# Patient Record
Sex: Female | Born: 1935
Health system: Southern US, Community
[De-identification: ages and names within clinical notes are randomized; demographics above are authoritative.]

## PROBLEM LIST (undated history)

## (undated) DIAGNOSIS — J45909 Unspecified asthma, uncomplicated: Secondary | ICD-10-CM

## (undated) DIAGNOSIS — E119 Type 2 diabetes mellitus without complications: Secondary | ICD-10-CM

## (undated) DIAGNOSIS — E7800 Pure hypercholesterolemia, unspecified: Secondary | ICD-10-CM

## (undated) DIAGNOSIS — G894 Chronic pain syndrome: Secondary | ICD-10-CM

## (undated) DIAGNOSIS — M79641 Pain in right hand: Secondary | ICD-10-CM

## (undated) DIAGNOSIS — C801 Malignant (primary) neoplasm, unspecified: Secondary | ICD-10-CM

## (undated) DIAGNOSIS — N289 Disorder of kidney and ureter, unspecified: Secondary | ICD-10-CM

## (undated) DIAGNOSIS — B0229 Other postherpetic nervous system involvement: Secondary | ICD-10-CM

## (undated) DIAGNOSIS — M5136 Other intervertebral disc degeneration, lumbar region: Secondary | ICD-10-CM

## (undated) DIAGNOSIS — I10 Essential (primary) hypertension: Secondary | ICD-10-CM

## (undated) DIAGNOSIS — M1990 Unspecified osteoarthritis, unspecified site: Secondary | ICD-10-CM

## (undated) DIAGNOSIS — M79642 Pain in left hand: Secondary | ICD-10-CM

## (undated) DIAGNOSIS — Z5189 Encounter for other specified aftercare: Secondary | ICD-10-CM

## (undated) HISTORY — DX: Chronic pain syndrome: G89.4

## (undated) HISTORY — PX: TUBAL LIGATION: SHX77

## (undated) HISTORY — DX: Pain in left hand: M79.642

## (undated) HISTORY — DX: Pain in right hand: M79.641

## (undated) HISTORY — DX: Other postherpetic nervous system involvement: B02.29

## (undated) HISTORY — DX: Other intervertebral disc degeneration, lumbar region: M51.36

## (undated) HISTORY — DX: Pure hypercholesterolemia, unspecified: E78.00

## (undated) HISTORY — PX: SKIN BIOPSY: SHX1

## (undated) HISTORY — PX: PAROTID GLAND TUMOR EXCISION: SHX5221

---

## 1966-03-04 HISTORY — PX: ABDOMINAL HYSTERECTOMY: SHX81

## 1975-03-05 HISTORY — PX: KNEE SURGERY: SHX244

## 1978-03-04 HISTORY — PX: BLADDER SURGERY: SHX569

## 2004-03-04 HISTORY — PX: CATARACT EXTRACTION: SUR2

## 2004-03-04 HISTORY — PX: LAPAROSCOPIC GASTRIC BANDING: SHX1100

## 2006-03-04 HISTORY — PX: JOINT REPLACEMENT: SHX530

## 2008-03-04 HISTORY — PX: SHOULDER SURGERY: SHX246

## 2012-05-26 ENCOUNTER — Emergency Department (HOSPITAL_COMMUNITY): Payer: Medicare (Managed Care)

## 2012-05-26 ENCOUNTER — Emergency Department (HOSPITAL_COMMUNITY)
Admission: EM | Admit: 2012-05-26 | Discharge: 2012-05-26 | Disposition: A | Discharge status: Home / Self Care | Payer: Medicare (Managed Care) | Attending: Emergency Medicine | Admitting: Emergency Medicine

## 2012-05-26 ENCOUNTER — Encounter (HOSPITAL_COMMUNITY): Payer: Self-pay

## 2012-05-26 DIAGNOSIS — I10 Essential (primary) hypertension: Secondary | ICD-10-CM | POA: Insufficient documentation

## 2012-05-26 DIAGNOSIS — IMO0001 Reserved for inherently not codable concepts without codable children: Secondary | ICD-10-CM

## 2012-05-26 DIAGNOSIS — J45909 Unspecified asthma, uncomplicated: Secondary | ICD-10-CM | POA: Insufficient documentation

## 2012-05-26 DIAGNOSIS — Z79899 Other long term (current) drug therapy: Secondary | ICD-10-CM | POA: Insufficient documentation

## 2012-05-26 DIAGNOSIS — Z87448 Personal history of other diseases of urinary system: Secondary | ICD-10-CM | POA: Insufficient documentation

## 2012-05-26 DIAGNOSIS — Z85858 Personal history of malignant neoplasm of other endocrine glands: Secondary | ICD-10-CM | POA: Insufficient documentation

## 2012-05-26 DIAGNOSIS — W108XXA Fall (on) (from) other stairs and steps, initial encounter: Secondary | ICD-10-CM | POA: Insufficient documentation

## 2012-05-26 DIAGNOSIS — Z5189 Encounter for other specified aftercare: Secondary | ICD-10-CM | POA: Insufficient documentation

## 2012-05-26 DIAGNOSIS — F17200 Nicotine dependence, unspecified, uncomplicated: Secondary | ICD-10-CM | POA: Insufficient documentation

## 2012-05-26 DIAGNOSIS — Z8739 Personal history of other diseases of the musculoskeletal system and connective tissue: Secondary | ICD-10-CM | POA: Insufficient documentation

## 2012-05-26 DIAGNOSIS — E119 Type 2 diabetes mellitus without complications: Secondary | ICD-10-CM | POA: Insufficient documentation

## 2012-05-26 DIAGNOSIS — Y9289 Other specified places as the place of occurrence of the external cause: Secondary | ICD-10-CM | POA: Insufficient documentation

## 2012-05-26 DIAGNOSIS — S79929A Unspecified injury of unspecified thigh, initial encounter: Secondary | ICD-10-CM | POA: Insufficient documentation

## 2012-05-26 DIAGNOSIS — S79919A Unspecified injury of unspecified hip, initial encounter: Secondary | ICD-10-CM | POA: Insufficient documentation

## 2012-05-26 DIAGNOSIS — Y9389 Activity, other specified: Secondary | ICD-10-CM | POA: Insufficient documentation

## 2012-05-26 DIAGNOSIS — S40019A Contusion of unspecified shoulder, initial encounter: Secondary | ICD-10-CM | POA: Insufficient documentation

## 2012-05-26 DIAGNOSIS — S0990XA Unspecified injury of head, initial encounter: Secondary | ICD-10-CM | POA: Insufficient documentation

## 2012-05-26 DIAGNOSIS — S20229A Contusion of unspecified back wall of thorax, initial encounter: Secondary | ICD-10-CM | POA: Insufficient documentation

## 2012-05-26 HISTORY — DX: Unspecified osteoarthritis, unspecified site: M19.90

## 2012-05-26 HISTORY — DX: Type 2 diabetes mellitus without complications: E11.9

## 2012-05-26 HISTORY — DX: Encounter for other specified aftercare: Z51.89

## 2012-05-26 HISTORY — DX: Essential (primary) hypertension: I10

## 2012-05-26 HISTORY — DX: Disorder of kidney and ureter, unspecified: N28.9

## 2012-05-26 HISTORY — DX: Malignant (primary) neoplasm, unspecified: C80.1

## 2012-05-26 HISTORY — DX: Unspecified asthma, uncomplicated: J45.909

## 2012-05-26 MED ORDER — HYDROCODONE-ACETAMINOPHEN 5-325 MG PO TABS: 1.0000 | ORAL_TABLET | ORAL | Status: DC | PRN

## 2012-05-26 MED ORDER — ACETAMINOPHEN 325 MG PO TABS
650.0000 mg | ORAL_TABLET | Freq: Once | ORAL | Status: AC
  Administered 2012-05-26: 650 mg via ORAL
  Filled 2012-05-26: qty 2

## 2012-05-26 NOTE — ED Notes (Signed)
Family at bedside. 

## 2012-05-26 NOTE — ED Provider Notes (Signed)
I saw and evaluated the patient, reviewed the resident's note and I agree with the findings and plan.  Dg Lumbar Spine 2-3 Views  05/26/2012  *RADIOLOGY REPORT*  Clinical Data: Fall, low back pain  LUMBAR SPINE - 2-3 VIEW  Comparison: None.  Findings: Five lumbar-type vertebral bodies.  Normal lumbar lordosis.  No evidence of fracture or dislocation.  Vertebral body heights are maintained.  Mild to moderate multilevel degenerative changes.  Visualized bony pelvis appears intact.  Degenerative changes of the pubic symphysis.  Lap band, incompletely visualized.  IMPRESSION: No fracture or dislocation is seen.  Mild to moderate multilevel degenerative changes.   Original Report Authenticated By: Charline Bills, M.D.    Dg Shoulder Right  05/26/2012  *RADIOLOGY REPORT*  Clinical Data: Fall, right arm pain  RIGHT SHOULDER - 2+ VIEW  Comparison: None.  Findings: No fracture or dislocation is seen.  Mild degenerative changes of the glenohumeral and acromioclavicular joints.  Visualized soft tissues are unremarkable.  Visualized right lung is clear.  IMPRESSION: No fracture or dislocation is seen.   Original Report Authenticated By: Charline Bills, M.D.    Dg Forearm Right  05/26/2012  *RADIOLOGY REPORT*  Clinical Data: Arm pain after fall.  RIGHT FOREARM - 2 VIEW  Comparison: None.  Findings: No fracture or dislocation is noted.  Elbow and wrist joints appear normal.  No foreign body or soft tissue abnormality is noted.  IMPRESSION: Normal right forearm.   Original Report Authenticated By: Lupita Raider.,  M.D.    Ct Head Wo Contrast  05/26/2012  *RADIOLOGY REPORT*  Clinical Data:  Larey Seat walking down steps, no loss of consciousness, left parietal soft tissue swelling  CT HEAD WITHOUT CONTRAST CT CERVICAL SPINE WITHOUT CONTRAST  Technique:  Multidetector CT imaging of the head and cervical spine was performed following the standard protocol without intravenous contrast.  Multiplanar CT image reconstructions  of the cervical spine were also generated.  Comparison:  None  CT HEAD  Findings: Minimal age-related atrophy. Normal ventricular morphology. No midline shift or mass effect. Otherwise normal appearance of brain parenchyma. No intracranial hemorrhage, mass lesion or evidence of acute infarction. No extra-axial fluid collections. Nasal septal deviation to the right. Bones and sinuses otherwise unremarkable.  IMPRESSION: No acute intracranial abnormalities.  CT CERVICAL SPINE  Findings: Beam hardening artifacts question related to shoulders. Extensive atherosclerotic calcifications. Visualized skull base intact. Multilevel facet degenerative changes. Disc space narrowing with endplate spur formation at C5-C6 and C6- V7. Minimal anterolisthesis C4-C5 with similar to, likely due to degenerative facet disease. Vertebral body heights maintained without fracture or subluxation. Prevertebral soft tissues normal thickness. Fat containing soft tissue nodule identified left submandibular within the left mylohyoid muscle, 14 x 11 x 12 mm in size, question small intramuscular lipoma, unlikely to represent accessory salivary tissue.  IMPRESSION: Multilevel degenerative disc and facet disease changes of the cervical spine. No acute cervical spine abnormalities.   Original Report Authenticated By: Ulyses Southward, M.D.    Ct Cervical Spine Wo Contrast  05/26/2012  *RADIOLOGY REPORT*  Clinical Data:  Larey Seat walking down steps, no loss of consciousness, left parietal soft tissue swelling  CT HEAD WITHOUT CONTRAST CT CERVICAL SPINE WITHOUT CONTRAST  Technique:  Multidetector CT imaging of the head and cervical spine was performed following the standard protocol without intravenous contrast.  Multiplanar CT image reconstructions of the cervical spine were also generated.  Comparison:  None  CT HEAD  Findings: Minimal age-related atrophy. Normal ventricular morphology. No midline shift  or mass effect. Otherwise normal appearance of brain  parenchyma. No intracranial hemorrhage, mass lesion or evidence of acute infarction. No extra-axial fluid collections. Nasal septal deviation to the right. Bones and sinuses otherwise unremarkable.  IMPRESSION: No acute intracranial abnormalities.  CT CERVICAL SPINE  Findings: Beam hardening artifacts question related to shoulders. Extensive atherosclerotic calcifications. Visualized skull base intact. Multilevel facet degenerative changes. Disc space narrowing with endplate spur formation at C5-C6 and C6- V7. Minimal anterolisthesis C4-C5 with similar to, likely due to degenerative facet disease. Vertebral body heights maintained without fracture or subluxation. Prevertebral soft tissues normal thickness. Fat containing soft tissue nodule identified left submandibular within the left mylohyoid muscle, 14 x 11 x 12 mm in size, question small intramuscular lipoma, unlikely to represent accessory salivary tissue.  IMPRESSION: Multilevel degenerative disc and facet disease changes of the cervical spine. No acute cervical spine abnormalities.   Original Report Authenticated By: Ulyses Southward, M.D.    Dg Humerus Right  05/26/2012  *RADIOLOGY REPORT*  Clinical Data: Fall, right arm pain  RIGHT HUMERUS - 2+ VIEW  Comparison: None.  Findings: No fracture or dislocation is seen.  The visualized soft tissues are unremarkable.  IMPRESSION: No fracture or dislocation is seen.   Original Report Authenticated By: Charline Bills, M.D.    Dg Hand Complete Right  05/26/2012  *RADIOLOGY REPORT*  Clinical Data: Fall, right hand/arm pain  RIGHT HAND - COMPLETE 3+ VIEW  Comparison: None.  Findings: No fracture or dislocation is seen.  Moderate degenerative changes at the first MCP joint.  The visualized soft tissues are unremarkable.  IMPRESSION: No fracture or dislocation is seen.   Original Report Authenticated By: Charline Bills, M.D.   I personally reviewed the imaging tests through PACS system I reviewed available  ER/hospitalization records through the EMR  Ambulated without difficulty.  Discharge home in good condition.   Lyanne Co, MD 05/26/12 [phone]

## 2012-05-26 NOTE — ED Notes (Signed)
Pt visiting family.  Pt was going down stairs and her shoe came off and she slipped and fell down 5 stairs.  Pt c/o R shoulder pain, tailbone pain, L hip pain.  Pt also states that when she landed she hit the left side of her head behind the ear.  Pt denies LOC.

## 2012-05-26 NOTE — ED Provider Notes (Signed)
History     CSN: [id number]  Arrival date & time 05/26/12  7829   First MD Initiated Contact with Patient 05/26/12 [phone]      Chief Complaint  Patient presents with  . Fall  . Shoulder Pain  . Tailbone Pain  . Hip Pain    (Consider location/radiation/quality/duration/timing/severity/associated sxs/prior treatment) Patient is a 78 y.o. female presenting with fall, shoulder pain, and hip pain. The history is provided by the patient.  Fall The accident occurred 1 to 2 hours ago. The fall occurred while walking. Distance fallen: 4-5 stairs. She landed on a hard floor. The point of impact was the head and right shoulder. The pain is present in the head (right arm). The pain is moderate. She was ambulatory at the scene. Pertinent negatives include no numbness, no abdominal pain, no loss of consciousness and no tingling. The symptoms are aggravated by pressure on the injury and use of the injured limb. She has tried nothing for the symptoms.  Shoulder Pain Pertinent negatives include no abdominal pain or numbness.  Hip Pain Pertinent negatives include no abdominal pain or numbness.    Past Medical History  Diagnosis Date  . Arthritis   . Asthma   . Blood transfusion without reported diagnosis   . Cancer   . Diabetes mellitus without complication   . Hypertension   . Renal disorder     Past Surgical History  Procedure Laterality Date  . Parotid gland tumor excision    . Joint replacement    . Abdominal hysterectomy    . Tubal ligation    . Bladder surgery    . Laparoscopic gastric banding    . Skin biopsy      No family history on file.  History  Substance Use Topics  . Smoking status: Current Every Day Smoker  . Smokeless tobacco: Not on file  . Alcohol Use: Yes     Comment: RARELY    OB History   Grav Para Term Preterm Abortions TAB SAB Ect Mult Living                  Review of Systems  Gastrointestinal: Negative for abdominal pain.  Neurological: Negative  for tingling, loss of consciousness and numbness.  All other systems reviewed and are negative.    Allergies  Review of patient's allergies indicates not on file.  Home Medications  No current outpatient prescriptions on file.  BP 151/83  Pulse 59  Temp(Src) 98.3 F (36.8 C) (Oral)  Resp 16  SpO2 100%  Physical Exam  Nursing note and vitals reviewed. Constitutional: She is oriented to person, place, and time. She appears well-developed and well-nourished. No distress.  HENT:  Head: Normocephalic and atraumatic.    Mouth/Throat: Oropharynx is clear and moist.  Eyes: Conjunctivae are normal. Pupils are equal, round, and reactive to light. No scleral icterus.  Neck: Normal range of motion. Neck supple. Spinous process tenderness (c6-7) and muscular tenderness (right) present. Normal range of motion present.  Cardiovascular: Normal rate, regular rhythm, normal heart sounds and intact distal pulses.   No murmur heard. Pulmonary/Chest: Effort normal and breath sounds normal. No stridor. No respiratory distress. She has no decreased breath sounds. She has no wheezes. She has no rales. She exhibits no tenderness.  Abdominal: Soft. Bowel sounds are normal. She exhibits no distension. There is no tenderness. There is no rigidity, no rebound and no guarding.  Musculoskeletal: Normal range of motion.       Thoracic  back: She exhibits no tenderness and no bony tenderness.       Lumbar back: She exhibits bony tenderness.       Arms: No evidence of trauma to extremities, except as noted.  2+ distal pulses.    Neurological: She is alert and oriented to person, place, and time. GCS eye subscore is 4. GCS verbal subscore is 5. GCS motor subscore is 6.  Skin: Skin is warm and dry. No rash noted.  Psychiatric: She has a normal mood and affect. Her behavior is normal.    ED Course  Procedures (including critical care time)  Labs Reviewed  GLUCOSE, CAPILLARY   Dg Lumbar Spine 2-3  Views  05/26/2012  *RADIOLOGY REPORT*  Clinical Data: Fall, low back pain  LUMBAR SPINE - 2-3 VIEW  Comparison: None.  Findings: Five lumbar-type vertebral bodies.  Normal lumbar lordosis.  No evidence of fracture or dislocation.  Vertebral body heights are maintained.  Mild to moderate multilevel degenerative changes.  Visualized bony pelvis appears intact.  Degenerative changes of the pubic symphysis.  Lap band, incompletely visualized.  IMPRESSION: No fracture or dislocation is seen.  Mild to moderate multilevel degenerative changes.   Original Report Authenticated By: Charline Bills, M.D.    Dg Shoulder Right  05/26/2012  *RADIOLOGY REPORT*  Clinical Data: Fall, right arm pain  RIGHT SHOULDER - 2+ VIEW  Comparison: None.  Findings: No fracture or dislocation is seen.  Mild degenerative changes of the glenohumeral and acromioclavicular joints.  Visualized soft tissues are unremarkable.  Visualized right lung is clear.  IMPRESSION: No fracture or dislocation is seen.   Original Report Authenticated By: Charline Bills, M.D.    Dg Forearm Right  05/26/2012  *RADIOLOGY REPORT*  Clinical Data: Arm pain after fall.  RIGHT FOREARM - 2 VIEW  Comparison: None.  Findings: No fracture or dislocation is noted.  Elbow and wrist joints appear normal.  No foreign body or soft tissue abnormality is noted.  IMPRESSION: Normal right forearm.   Original Report Authenticated By: Lupita Raider.,  M.D.    Ct Head Wo Contrast  05/26/2012  *RADIOLOGY REPORT*  Clinical Data:  Larey Seat walking down steps, no loss of consciousness, left parietal soft tissue swelling  CT HEAD WITHOUT CONTRAST CT CERVICAL SPINE WITHOUT CONTRAST  Technique:  Multidetector CT imaging of the head and cervical spine was performed following the standard protocol without intravenous contrast.  Multiplanar CT image reconstructions of the cervical spine were also generated.  Comparison:  None  CT HEAD  Findings: Minimal age-related atrophy. Normal  ventricular morphology. No midline shift or mass effect. Otherwise normal appearance of brain parenchyma. No intracranial hemorrhage, mass lesion or evidence of acute infarction. No extra-axial fluid collections. Nasal septal deviation to the right. Bones and sinuses otherwise unremarkable.  IMPRESSION: No acute intracranial abnormalities.  CT CERVICAL SPINE  Findings: Beam hardening artifacts question related to shoulders. Extensive atherosclerotic calcifications. Visualized skull base intact. Multilevel facet degenerative changes. Disc space narrowing with endplate spur formation at C5-C6 and C6- V7. Minimal anterolisthesis C4-C5 with similar to, likely due to degenerative facet disease. Vertebral body heights maintained without fracture or subluxation. Prevertebral soft tissues normal thickness. Fat containing soft tissue nodule identified left submandibular within the left mylohyoid muscle, 14 x 11 x 12 mm in size, question small intramuscular lipoma, unlikely to represent accessory salivary tissue.  IMPRESSION: Multilevel degenerative disc and facet disease changes of the cervical spine. No acute cervical spine abnormalities.   Original Report Authenticated By:  Ulyses Southward, M.D.    Ct Cervical Spine Wo Contrast  05/26/2012  *RADIOLOGY REPORT*  Clinical Data:  Larey Seat walking down steps, no loss of consciousness, left parietal soft tissue swelling  CT HEAD WITHOUT CONTRAST CT CERVICAL SPINE WITHOUT CONTRAST  Technique:  Multidetector CT imaging of the head and cervical spine was performed following the standard protocol without intravenous contrast.  Multiplanar CT image reconstructions of the cervical spine were also generated.  Comparison:  None  CT HEAD  Findings: Minimal age-related atrophy. Normal ventricular morphology. No midline shift or mass effect. Otherwise normal appearance of brain parenchyma. No intracranial hemorrhage, mass lesion or evidence of acute infarction. No extra-axial fluid collections.  Nasal septal deviation to the right. Bones and sinuses otherwise unremarkable.  IMPRESSION: No acute intracranial abnormalities.  CT CERVICAL SPINE  Findings: Beam hardening artifacts question related to shoulders. Extensive atherosclerotic calcifications. Visualized skull base intact. Multilevel facet degenerative changes. Disc space narrowing with endplate spur formation at C5-C6 and C6- V7. Minimal anterolisthesis C4-C5 with similar to, likely due to degenerative facet disease. Vertebral body heights maintained without fracture or subluxation. Prevertebral soft tissues normal thickness. Fat containing soft tissue nodule identified left submandibular within the left mylohyoid muscle, 14 x 11 x 12 mm in size, question small intramuscular lipoma, unlikely to represent accessory salivary tissue.  IMPRESSION: Multilevel degenerative disc and facet disease changes of the cervical spine. No acute cervical spine abnormalities.   Original Report Authenticated By: Ulyses Southward, M.D.    Dg Humerus Right  05/26/2012  *RADIOLOGY REPORT*  Clinical Data: Fall, right arm pain  RIGHT HUMERUS - 2+ VIEW  Comparison: None.  Findings: No fracture or dislocation is seen.  The visualized soft tissues are unremarkable.  IMPRESSION: No fracture or dislocation is seen.   Original Report Authenticated By: Charline Bills, M.D.    Dg Hand Complete Right  05/26/2012  *RADIOLOGY REPORT*  Clinical Data: Fall, right hand/arm pain  RIGHT HAND - COMPLETE 3+ VIEW  Comparison: None.  Findings: No fracture or dislocation is seen.  Moderate degenerative changes at the first MCP joint.  The visualized soft tissues are unremarkable.  IMPRESSION: No fracture or dislocation is seen.   Original Report Authenticated By: Charline Bills, M.D.   All radiology studies independently viewed by me.      1. Fall down stairs, initial encounter   2. Contusion shoulder/arm, right, initial encounter   3. Contusion of back, unspecified laterality, initial  encounter       MDM  Fell down stairs.  Mechanical, tripped over shoe.  Mostly complains of right arm pain.  No LOC, but does report head trauma.  C spine tenderness with good ROM.  Low suspicion for significant injury, but will place in collar and image.  Tylenol for pain.  pending imaging.   10:16 AM Imaging negative.  C collar dc'd, ligamentous injury very unlikely based on exam and mechanism.  Plan to ambulate.    11:17 AM ambulated without difficulty.  Will dc home.         Rennis Petty, MD 05/26/12 [phone]

## 2012-10-31 ENCOUNTER — Emergency Department (HOSPITAL_COMMUNITY)
Admission: EM | Admit: 2012-10-31 | Discharge: 2012-11-01 | Disposition: A | Discharge status: Home / Self Care | Payer: Medicare (Managed Care) | Attending: Emergency Medicine | Admitting: Emergency Medicine

## 2012-10-31 ENCOUNTER — Emergency Department (HOSPITAL_COMMUNITY): Payer: Medicare (Managed Care)

## 2012-10-31 ENCOUNTER — Encounter (HOSPITAL_COMMUNITY): Payer: Self-pay | Admitting: Emergency Medicine

## 2012-10-31 DIAGNOSIS — J45901 Unspecified asthma with (acute) exacerbation: Secondary | ICD-10-CM

## 2012-10-31 DIAGNOSIS — Z885 Allergy status to narcotic agent status: Secondary | ICD-10-CM | POA: Insufficient documentation

## 2012-10-31 DIAGNOSIS — E119 Type 2 diabetes mellitus without complications: Secondary | ICD-10-CM | POA: Insufficient documentation

## 2012-10-31 DIAGNOSIS — Z79899 Other long term (current) drug therapy: Secondary | ICD-10-CM | POA: Insufficient documentation

## 2012-10-31 DIAGNOSIS — I129 Hypertensive chronic kidney disease with stage 1 through stage 4 chronic kidney disease, or unspecified chronic kidney disease: Secondary | ICD-10-CM | POA: Insufficient documentation

## 2012-10-31 DIAGNOSIS — Z882 Allergy status to sulfonamides status: Secondary | ICD-10-CM | POA: Insufficient documentation

## 2012-10-31 DIAGNOSIS — F17200 Nicotine dependence, unspecified, uncomplicated: Secondary | ICD-10-CM | POA: Insufficient documentation

## 2012-10-31 DIAGNOSIS — Z859 Personal history of malignant neoplasm, unspecified: Secondary | ICD-10-CM | POA: Insufficient documentation

## 2012-10-31 DIAGNOSIS — N289 Disorder of kidney and ureter, unspecified: Secondary | ICD-10-CM | POA: Insufficient documentation

## 2012-10-31 DIAGNOSIS — Z880 Allergy status to penicillin: Secondary | ICD-10-CM | POA: Insufficient documentation

## 2012-10-31 DIAGNOSIS — Z5189 Encounter for other specified aftercare: Secondary | ICD-10-CM | POA: Insufficient documentation

## 2012-10-31 DIAGNOSIS — M129 Arthropathy, unspecified: Secondary | ICD-10-CM | POA: Insufficient documentation

## 2012-10-31 DIAGNOSIS — J069 Acute upper respiratory infection, unspecified: Secondary | ICD-10-CM | POA: Insufficient documentation

## 2012-10-31 MED ORDER — ALBUTEROL SULFATE (5 MG/ML) 0.5% IN NEBU
5.0000 mg | INHALATION_SOLUTION | Freq: Once | RESPIRATORY_TRACT | Status: AC
  Administered 2012-10-31: 5 mg via RESPIRATORY_TRACT
  Filled 2012-10-31: qty 1

## 2012-10-31 NOTE — ED Notes (Addendum)
Pt. reports wheezing with productive cough for 3 days unrelieved by home nebulizer treatment , denies fever .

## 2012-11-01 LAB — POCT I-STAT, CHEM 8
BUN: 14 mg/dL (ref 6–23)
Calcium, Ion: 1.25 mmol/L (ref 1.13–1.30)
Creatinine, Ser: 0.9 mg/dL (ref 0.50–1.10)
Glucose, Bld: 152 mg/dL — ABNORMAL HIGH (ref 70–99)
TCO2: 23 mmol/L (ref 0–100)

## 2012-11-01 LAB — CBC
HCT: 37.6 % (ref 36.0–46.0)
MCH: 32.8 pg (ref 26.0–34.0)
MCV: 96.4 fL (ref 78.0–100.0)
Platelets: 208 10*3/uL (ref 150–400)
RBC: 3.9 MIL/uL (ref 3.87–5.11)

## 2012-11-01 MED ORDER — ALBUTEROL SULFATE (5 MG/ML) 0.5% IN NEBU
5.0000 mg | INHALATION_SOLUTION | Freq: Once | RESPIRATORY_TRACT | Status: AC
  Administered 2012-11-01: 5 mg via RESPIRATORY_TRACT
  Filled 2012-11-01: qty 1

## 2012-11-01 MED ORDER — PREDNISONE 20 MG PO TABS: 60.0000 mg | ORAL_TABLET | Freq: Every day | ORAL | Status: DC

## 2012-11-01 MED ORDER — ALBUTEROL SULFATE HFA 108 (90 BASE) MCG/ACT IN AERS: 1.0000 | INHALATION_SPRAY | Freq: Four times a day (QID) | RESPIRATORY_TRACT | Status: DC | PRN

## 2012-11-01 MED ORDER — HYDROCODONE-ACETAMINOPHEN 7.5-325 MG/15ML PO SOLN: 5.0000 mL | Freq: Every evening | ORAL | Status: DC | PRN

## 2012-11-01 MED ORDER — PREDNISONE 20 MG PO TABS
60.0000 mg | ORAL_TABLET | Freq: Once | ORAL | Status: AC
  Administered 2012-11-01: 60 mg via ORAL
  Filled 2012-11-01: qty 3

## 2012-11-01 NOTE — ED Provider Notes (Signed)
CSN: [id number]     Arrival date & time 10/31/12  2258 History   First MD Initiated Contact with Patient 10/31/12 2336     Chief Complaint  Patient presents with  . Asthma   (Consider location/radiation/quality/duration/timing/severity/associated sxs/prior Treatment) HPI History provided by patient. visiting family is from out of town. Has history of asthma and presents complaining of persistent asthma symptoms despite her inhalers. She has moderate wheezing with some congestion, believes that she has a flare of her allergies. No fevers. No productive cough. Is planning to stand area for the next few weeks. No chest pain or tightness. No leg pain or leg swelling.  Past Medical History  Diagnosis Date  . Arthritis   . Asthma   . Blood transfusion without reported diagnosis   . Cancer   . Diabetes mellitus without complication   . Hypertension   . Renal disorder    Past Surgical History  Procedure Laterality Date  . Parotid gland tumor excision    . Joint replacement    . Abdominal hysterectomy    . Tubal ligation    . Bladder surgery    . Laparoscopic gastric banding    . Skin biopsy     No family history on file. History  Substance Use Topics  . Smoking status: Current Every Day Smoker  . Smokeless tobacco: Not on file  . Alcohol Use: Yes     Comment: RARELY   OB History   Grav Para Term Preterm Abortions TAB SAB Ect Mult Living                 Review of Systems  Constitutional: Negative for fever and chills.  HENT: Negative for neck pain and neck stiffness.   Eyes: Negative for pain.  Respiratory: Positive for shortness of breath and wheezing.   Cardiovascular: Negative for chest pain.  Gastrointestinal: Negative for abdominal pain.  Genitourinary: Negative for dysuria.  Musculoskeletal: Negative for back pain.  Skin: Negative for rash.  Neurological: Negative for headaches.  All other systems reviewed and are negative.    Allergies  Penicillins; Sulfa  antibiotics; and Codeine  Home Medications   Current Outpatient Rx  Name  Route  Sig  Dispense  Refill  . acetaminophen (TYLENOL) 500 MG tablet   Oral   Take 1,000 mg by mouth every 6 (six) hours as needed for pain.         Marland Kitchen albuterol (PROVENTIL HFA;VENTOLIN HFA) 108 (90 BASE) MCG/ACT inhaler   Inhalation   Inhale 2 puffs into the lungs every 6 (six) hours as needed for wheezing or shortness of breath.         Marland Kitchen albuterol (PROVENTIL) (2.5 MG/3ML) 0.083% nebulizer solution   Nebulization   Take 2.5 mg by nebulization every 6 (six) hours as needed for wheezing.         . Biotin 5000 MCG CAPS   Oral   Take 1 capsule by mouth daily.         . Cholecalciferol (VITAMIN D-3 PO)   Oral   Take 1 tablet by mouth daily.         . Cyanocobalamin (B-12 PO)   Sublingual   Place 1 tablet under the tongue daily.         . cyclobenzaprine (FLEXERIL) 5 MG tablet   Oral   Take 5 mg by mouth 3 (three) times daily as needed for muscle spasms.         . Fluticasone-Salmeterol (ADVAIR) 250-50  MCG/DOSE AEPB   Inhalation   Inhale 1 puff into the lungs 2 (two) times daily as needed (for shortness of breath).         . furosemide (LASIX) 40 MG tablet   Oral   Take 40 mg by mouth daily as needed (for swelling).         . gabapentin (NEURONTIN) 300 MG capsule   Oral   Take 300 mg by mouth 3 (three) times daily.         Marland Kitchen glipiZIDE (GLUCOTROL) 10 MG tablet   Oral   Take 5 mg by mouth daily.         Marland Kitchen lisinopril (PRINIVIL,ZESTRIL) 10 MG tablet   Oral   Take 10 mg by mouth daily.         . metFORMIN (GLUCOPHAGE) 1000 MG tablet   Oral   Take 1,000 mg by mouth at bedtime as needed (for elevated blood sugar- >140).          . metoprolol succinate (TOPROL-XL) 50 MG 24 hr tablet   Oral   Take 50 mg by mouth daily. Take with or immediately following a meal.         . Multiple Vitamin (MULTIVITAMIN WITH MINERALS) TABS   Oral   Take 1 tablet by mouth daily.          . potassium chloride SA (K-DUR,KLOR-CON) 20 MEQ tablet   Oral   Take 20 mEq by mouth daily.         Marland Kitchen rOPINIRole (REQUIP) 1 MG tablet   Oral   Take 1 mg by mouth every morning.         Marland Kitchen rOPINIRole (REQUIP) 3 MG tablet   Oral   Take 3 mg by mouth at bedtime.         . simvastatin (ZOCOR) 20 MG tablet   Oral   Take 20 mg by mouth daily.         Marland Kitchen tiotropium (SPIRIVA) 18 MCG inhalation capsule   Inhalation   Place 18 mcg into inhaler and inhale daily as needed (for shortness of breath).         . traMADol (ULTRAM) 50 MG tablet   Oral   Take 50 mg by mouth every 6 (six) hours as needed for pain.          BP 140/68  Pulse 68  Temp(Src) 97.9 F (36.6 C) (Oral)  Resp 18  Ht 5' (1.524 m)  Wt 175 lb (79.379 kg)  BMI 34.18 kg/m2  SpO2 100% Physical Exam  Constitutional: She is oriented to person, place, and time. She appears well-developed and well-nourished.  HENT:  Head: Normocephalic and atraumatic.  Eyes: EOM are normal. Pupils are equal, round, and reactive to light.  Neck: Neck supple.  Cardiovascular: Normal rate, regular rhythm and intact distal pulses.   Pulmonary/Chest: Effort normal. No respiratory distress.  Bilateral expiratory wheezes with prolonged expirations and mildly decreased bilateral breath sounds  Abdominal: Soft. She exhibits no distension.  Musculoskeletal: Normal range of motion. She exhibits no edema and no tenderness.  Neurological: She is alert and oriented to person, place, and time.  Skin: Skin is warm and dry.    ED Course  Procedures (including critical care time) Labs Review Labs Reviewed  CBC    I-STAT chem 8 results: Sodium 141, potassium 3.9, chloride 107, CO2 23, glucose 152, BUN 14, creatinine 0.9  Imaging Review Dg Chest 2 View  11/01/2012   CLINICAL DATA:  Asthma, shortness of breath, cough, congestion.  EXAM: CHEST  2 VIEW  COMPARISON:  None.  FINDINGS: The heart size and mediastinal contours are within  normal limits. Both lungs are clear. The visualized skeletal structures are unremarkable.  IMPRESSION: No active cardiopulmonary disease.   Electronically Signed   By: Charlett Nose   On: 11/01/2012 00:00   Recheck after initial albuterol treatment is still symptomatic with room air pulse ox 95%, still has bilateral expiratory wheezes and mildly decreased air movement/ prolonged expirations  2:13 AM recheck feeling much better requesting d/c home, has inhalers and agrees to strict returm precautions. PT requesting something for cough and agrees to all d/c and f/u instructions  MDM  Dx: Acute Asthma Exacerbation, URI symptoms  CXR Albuterol/ steroids Serial evaluations/ improved condition  VS and nurses notes reviewed     Sunnie Nielsen, MD 11/02/12 [phone]

## 2012-11-09 ENCOUNTER — Emergency Department (HOSPITAL_COMMUNITY)
Admission: EM | Admit: 2012-11-09 | Discharge: 2012-11-10 | Disposition: A | Discharge status: Home / Self Care | Payer: Medicare (Managed Care) | Attending: Emergency Medicine | Admitting: Emergency Medicine

## 2012-11-09 ENCOUNTER — Emergency Department (HOSPITAL_COMMUNITY): Payer: Medicare (Managed Care)

## 2012-11-09 ENCOUNTER — Encounter (HOSPITAL_COMMUNITY): Payer: Self-pay | Admitting: Nurse Practitioner

## 2012-11-09 DIAGNOSIS — I10 Essential (primary) hypertension: Secondary | ICD-10-CM | POA: Insufficient documentation

## 2012-11-09 DIAGNOSIS — R059 Cough, unspecified: Secondary | ICD-10-CM

## 2012-11-09 DIAGNOSIS — IMO0002 Reserved for concepts with insufficient information to code with codable children: Secondary | ICD-10-CM | POA: Insufficient documentation

## 2012-11-09 DIAGNOSIS — Z79899 Other long term (current) drug therapy: Secondary | ICD-10-CM | POA: Insufficient documentation

## 2012-11-09 DIAGNOSIS — R079 Chest pain, unspecified: Secondary | ICD-10-CM | POA: Insufficient documentation

## 2012-11-09 DIAGNOSIS — Z880 Allergy status to penicillin: Secondary | ICD-10-CM | POA: Insufficient documentation

## 2012-11-09 DIAGNOSIS — Z87891 Personal history of nicotine dependence: Secondary | ICD-10-CM | POA: Insufficient documentation

## 2012-11-09 DIAGNOSIS — Z792 Long term (current) use of antibiotics: Secondary | ICD-10-CM | POA: Insufficient documentation

## 2012-11-09 DIAGNOSIS — R05 Cough: Secondary | ICD-10-CM

## 2012-11-09 DIAGNOSIS — E119 Type 2 diabetes mellitus without complications: Secondary | ICD-10-CM | POA: Insufficient documentation

## 2012-11-09 DIAGNOSIS — Z87448 Personal history of other diseases of urinary system: Secondary | ICD-10-CM | POA: Insufficient documentation

## 2012-11-09 DIAGNOSIS — J45901 Unspecified asthma with (acute) exacerbation: Secondary | ICD-10-CM | POA: Insufficient documentation

## 2012-11-09 DIAGNOSIS — Z85858 Personal history of malignant neoplasm of other endocrine glands: Secondary | ICD-10-CM | POA: Insufficient documentation

## 2012-11-09 DIAGNOSIS — M129 Arthropathy, unspecified: Secondary | ICD-10-CM | POA: Insufficient documentation

## 2012-11-09 DIAGNOSIS — J209 Acute bronchitis, unspecified: Secondary | ICD-10-CM

## 2012-11-09 LAB — CBC
HCT: 42.1 % (ref 36.0–46.0)
MCH: 33.4 pg (ref 26.0–34.0)
MCHC: 34.2 g/dL (ref 30.0–36.0)
MCV: 97.7 fL (ref 78.0–100.0)
Platelets: 250 10*3/uL (ref 150–400)
RDW: 13.5 % (ref 11.5–15.5)
WBC: 12.2 10*3/uL — ABNORMAL HIGH (ref 4.0–10.5)

## 2012-11-09 LAB — BASIC METABOLIC PANEL
BUN: 27 mg/dL — ABNORMAL HIGH (ref 6–23)
Calcium: 9.6 mg/dL (ref 8.4–10.5)
Creatinine, Ser: 0.79 mg/dL (ref 0.50–1.10)
GFR calc Af Amer: >90 mL/min (ref >90)
GFR calc non Af Amer: 79 mL/min — ABNORMAL LOW (ref >90)

## 2012-11-09 LAB — PRO B NATRIURETIC PEPTIDE: Pro B Natriuretic peptide (BNP): 180.4 pg/mL (ref 0–450)

## 2012-11-09 MED ORDER — AZITHROMYCIN 250 MG PO TABS: 250.0000 mg | ORAL_TABLET | Freq: Every day | ORAL | Status: DC

## 2012-11-09 MED ORDER — HYDROCODONE-ACETAMINOPHEN 7.5-325 MG/15ML PO SOLN
10.0000 mL | Freq: Once | ORAL | Status: AC
  Administered 2012-11-09: 10 mL via ORAL
  Filled 2012-11-09: qty 15

## 2012-11-09 MED ORDER — PREDNISONE 20 MG PO TABS
60.0000 mg | ORAL_TABLET | Freq: Once | ORAL | Status: AC
  Administered 2012-11-09: 60 mg via ORAL
  Filled 2012-11-09: qty 3

## 2012-11-09 MED ORDER — HYDROCODONE-ACETAMINOPHEN 7.5-325 MG/15ML PO SOLN: 10.0000 mL | Freq: Four times a day (QID) | ORAL | Status: AC | PRN

## 2012-11-09 MED ORDER — ALBUTEROL SULFATE (5 MG/ML) 0.5% IN NEBU
2.5000 mg | INHALATION_SOLUTION | Freq: Once | RESPIRATORY_TRACT | Status: AC
  Administered 2012-11-09: 2.5 mg via RESPIRATORY_TRACT
  Filled 2012-11-09: qty 0.5

## 2012-11-09 MED ORDER — PREDNISONE 50 MG PO TABS: 50.0000 mg | ORAL_TABLET | Freq: Every day | ORAL | Status: AC

## 2012-11-09 NOTE — ED Notes (Signed)
Pt was here Saturday and treated for asthma attack, discharged home. Daughter states pt continues to have SOB and is now having a productive cough and "chills." pt took full course of prednisone as prescribed

## 2012-11-09 NOTE — ED Provider Notes (Signed)
CSN: [id number]     Arrival date & time 11/09/12  1825 History   First MD Initiated Contact with Patient 11/09/12 2134     Chief Complaint  Patient presents with  . Shortness of Breath   (Consider location/radiation/quality/duration/timing/severity/associated sxs/prior Treatment) HPI This is a 78 year old female with history of diabetes, hypertension, and asthma who presents with persistent cough and wheezing. The patient reports 2 and a half weeks of cough that is sometimes productive. She states that she feels like she can't take a deep breath.  The patient was seen and evaluated about 10 days ago for the same. At that time she was given an albuterol neb and steroids. Patient states that this hasn't really helped. Patient reports not being able to sleep because of cough. Patient does report a dull ache over her chest that is worse with coughing. Patient denies any fevers. Patient denies any history of blood clots or swelling to the bilateral lower extremities.  Patient further denies any abdominal pain, urinary symptoms, headache, focal weakness or numbness. Past Medical History  Diagnosis Date  . Arthritis   . Asthma   . Blood transfusion without reported diagnosis   . Cancer   . Diabetes mellitus without complication   . Hypertension   . Renal disorder    Past Surgical History  Procedure Laterality Date  . Parotid gland tumor excision    . Joint replacement    . Abdominal hysterectomy    . Tubal ligation    . Bladder surgery    . Laparoscopic gastric banding    . Skin biopsy     History reviewed. No pertinent family history. History  Substance Use Topics  . Smoking status: Former Games developer  . Smokeless tobacco: Not on file  . Alcohol Use: Yes     Comment: RARELY   OB History   Grav Para Term Preterm Abortions TAB SAB Ect Mult Living                 Review of Systems  Constitutional: Negative for fever.  Respiratory: Positive for cough and shortness of breath. Negative for  chest tightness.   Cardiovascular: Positive for chest pain.  Gastrointestinal: Negative for nausea, vomiting and abdominal pain.  Genitourinary: Negative for dysuria.  Musculoskeletal: Negative for back pain.  Skin: Negative for rash.  Neurological: Negative for headaches.  Psychiatric/Behavioral: The patient is not nervous/anxious.   All other systems reviewed and are negative.    Allergies  Penicillins; Sulfa antibiotics; and Codeine  Home Medications   Current Outpatient Rx  Name  Route  Sig  Dispense  Refill  . acetaminophen (TYLENOL) 500 MG tablet   Oral   Take 1,000 mg by mouth every 6 (six) hours as needed for pain.         Marland Kitchen albuterol (PROVENTIL HFA;VENTOLIN HFA) 108 (90 BASE) MCG/ACT inhaler   Inhalation   Inhale 2 puffs into the lungs every 6 (six) hours as needed for wheezing or shortness of breath.         Marland Kitchen albuterol (PROVENTIL HFA;VENTOLIN HFA) 108 (90 BASE) MCG/ACT inhaler   Inhalation   Inhale 1-2 puffs into the lungs every 6 (six) hours as needed for wheezing.   1 Inhaler   0   . albuterol (PROVENTIL) (2.5 MG/3ML) 0.083% nebulizer solution   Nebulization   Take 2.5 mg by nebulization every 6 (six) hours as needed for wheezing.         . Biotin 5000 MCG CAPS  Oral   Take 1 capsule by mouth daily.         . Cholecalciferol (VITAMIN D-3 PO)   Oral   Take 1 tablet by mouth daily.         . Cyanocobalamin (B-12 PO)   Sublingual   Place 1 tablet under the tongue daily.         . cyclobenzaprine (FLEXERIL) 5 MG tablet   Oral   Take 5 mg by mouth 3 (three) times daily as needed for muscle spasms.         . Fluticasone-Salmeterol (ADVAIR) 250-50 MCG/DOSE AEPB   Inhalation   Inhale 1 puff into the lungs 2 (two) times daily as needed (for shortness of breath).         . furosemide (LASIX) 40 MG tablet   Oral   Take 40 mg by mouth daily as needed (for swelling).         . gabapentin (NEURONTIN) 300 MG capsule   Oral   Take 300 mg  by mouth 3 (three) times daily.         Marland Kitchen glipiZIDE (GLUCOTROL) 10 MG tablet   Oral   Take 5 mg by mouth daily.         Marland Kitchen lisinopril (PRINIVIL,ZESTRIL) 10 MG tablet   Oral   Take 10 mg by mouth daily.         . metFORMIN (GLUCOPHAGE) 1000 MG tablet   Oral   Take 1,000 mg by mouth at bedtime as needed (for elevated blood sugar- >140).          . metoprolol succinate (TOPROL-XL) 50 MG 24 hr tablet   Oral   Take 50 mg by mouth daily. Take with or immediately following a meal.         . Multiple Vitamin (MULTIVITAMIN WITH MINERALS) TABS   Oral   Take 1 tablet by mouth daily.         . potassium chloride SA (K-DUR,KLOR-CON) 20 MEQ tablet   Oral   Take 20 mEq by mouth daily.         . predniSONE (DELTASONE) 20 MG tablet   Oral   Take 3 tablets (60 mg total) by mouth daily.   15 tablet   0   . rOPINIRole (REQUIP) 1 MG tablet   Oral   Take 1 mg by mouth every morning.         Marland Kitchen rOPINIRole (REQUIP) 3 MG tablet   Oral   Take 3 mg by mouth at bedtime.         Marland Kitchen tiotropium (SPIRIVA) 18 MCG inhalation capsule   Inhalation   Place 18 mcg into inhaler and inhale daily as needed (for shortness of breath).         . traMADol (ULTRAM) 50 MG tablet   Oral   Take 50 mg by mouth every 6 (six) hours as needed for pain.         Marland Kitchen azithromycin (ZITHROMAX) 250 MG tablet   Oral   Take 1 tablet (250 mg total) by mouth daily. Take first 2 tablets together, then 1 every day until finished.   6 tablet   0   . HYDROcodone-acetaminophen (HYCET) 7.5-325 mg/15 ml solution   Oral   Take 10 mLs by mouth every 6 (six) hours as needed for pain or cough.   120 mL   0   . predniSONE (DELTASONE) 50 MG tablet   Oral   Take 1 tablet (  50 mg total) by mouth daily.   5 tablet   0   . simvastatin (ZOCOR) 20 MG tablet   Oral   Take 20 mg by mouth daily.          BP 140/119  Pulse 73  Temp(Src) 98.3 F (36.8 C) (Oral)  Resp 16  Ht 5' (1.524 m)  Wt 180 lb (81.647  kg)  BMI 35.15 kg/m2  SpO2 100% Physical Exam  Nursing note and vitals reviewed. Constitutional: She is oriented to person, place, and time. She appears well-developed and well-nourished. No distress.  Coughing  HENT:  Head: Normocephalic and atraumatic.  Eyes: Pupils are equal, round, and reactive to light.  Neck: Neck supple.  Cardiovascular: Normal rate, regular rhythm and normal heart sounds.   No murmur heard. Pulmonary/Chest: Effort normal. No respiratory distress. She has wheezes. She has no rales. She exhibits no tenderness.  Abdominal: Soft. Bowel sounds are normal. She exhibits no distension. There is no tenderness.  Musculoskeletal: She exhibits no edema.  Neurological: She is alert and oriented to person, place, and time.  Skin: Skin is warm and dry.  Psychiatric: She has a normal mood and affect.    ED Course  Procedures (including critical care time) Labs Review Labs Reviewed  CBC - Abnormal; Notable for the following:    WBC 12.2 (*)    All other components within normal limits  BASIC METABOLIC PANEL - Abnormal; Notable for the following:    Potassium 5.2 (*)    Glucose, Bld 194 (*)    BUN 27 (*)    GFR calc non Af Amer 79 (*)    All other components within normal limits  PRO B NATRIURETIC PEPTIDE  POTASSIUM  POCT I-STAT TROPONIN I   Imaging Review Dg Chest 2 View  11/09/2012   *RADIOLOGY REPORT*  Clinical Data: Shortness of breath and chest pain  CHEST - 2 VIEW  Comparison: October 31, 2012  Findings: Lungs clear.  Heart size and pulmonary vascularity are normal.  No adenopathy.  There is degenerative change in the thoracic spine.  There is a shoulder prosthesis. There is a lap band at the gastric cardia.  Impression:No edema or consolidation.   Original Report Authenticated By: Bretta Bang, M.D.    MDM   1. Bronchitis with asthma, acute   2. Cough    This is a 78 year old female with a history of asthma who presents with continued cough and  wheezing. She is nontoxic-appearing on exam. She does not have any evidence of hypoxia. Chest x-ray is negative. Basic labwork was obtained. Patient has a mild leukocytosis with a white count of 12.2. Patient was given a nebulizer treatment and Lortab elixir for her cough. Patient was also given 50 mg of prednisone. Patient had improvement of her symptoms. Given that the patient's cough is the most distressful part of her symptoms and that is keeping her up at night, I will prescribe her a short course of Lortab for cough. I will also give her another burst dose of steroids. Given her mild leukocytosis, patient will be given azithromycin. I cannot rule out early pneumonia and given the persistence of her symptoms in the setting of leukocytosis, I think is reasonable to cover her for community acquired pneumonia.  Patient is just visiting her daughter does not have a primary care physician here. She will return to the emergency her room if she has any further complaints.  After history, exam, and medical workup I feel the  patient has been appropriately medically screened and is safe for discharge home. Pertinent diagnoses were discussed with the patient. Patient was given return precautions.    Shon Baton, MD 11/10/12 0000

## 2017-04-08 DIAGNOSIS — E119 Type 2 diabetes mellitus without complications: Secondary | ICD-10-CM | POA: Diagnosis not present

## 2017-04-08 DIAGNOSIS — J4520 Mild intermittent asthma, uncomplicated: Secondary | ICD-10-CM | POA: Diagnosis not present

## 2017-04-08 DIAGNOSIS — G8929 Other chronic pain: Secondary | ICD-10-CM | POA: Diagnosis not present

## 2017-04-08 DIAGNOSIS — M150 Primary generalized (osteo)arthritis: Secondary | ICD-10-CM | POA: Diagnosis not present

## 2017-05-28 ENCOUNTER — Other Ambulatory Visit: Payer: Self-pay

## 2017-05-28 ENCOUNTER — Emergency Department (HOSPITAL_COMMUNITY): Payer: PRIVATE HEALTH INSURANCE

## 2017-05-28 ENCOUNTER — Encounter (HOSPITAL_COMMUNITY): Payer: Self-pay

## 2017-05-28 ENCOUNTER — Emergency Department (HOSPITAL_COMMUNITY)
Admission: EM | Admit: 2017-05-28 | Discharge: 2017-05-28 | Disposition: A | Discharge status: Home / Self Care | Payer: PRIVATE HEALTH INSURANCE | Attending: Emergency Medicine | Admitting: Emergency Medicine

## 2017-05-28 DIAGNOSIS — N12 Tubulo-interstitial nephritis, not specified as acute or chronic: Secondary | ICD-10-CM

## 2017-05-28 DIAGNOSIS — I10 Essential (primary) hypertension: Secondary | ICD-10-CM | POA: Insufficient documentation

## 2017-05-28 DIAGNOSIS — E1165 Type 2 diabetes mellitus with hyperglycemia: Secondary | ICD-10-CM | POA: Diagnosis not present

## 2017-05-28 DIAGNOSIS — J45909 Unspecified asthma, uncomplicated: Secondary | ICD-10-CM | POA: Diagnosis not present

## 2017-05-28 DIAGNOSIS — R112 Nausea with vomiting, unspecified: Secondary | ICD-10-CM | POA: Diagnosis not present

## 2017-05-28 DIAGNOSIS — Z79899 Other long term (current) drug therapy: Secondary | ICD-10-CM | POA: Diagnosis not present

## 2017-05-28 DIAGNOSIS — Z7984 Long term (current) use of oral hypoglycemic drugs: Secondary | ICD-10-CM | POA: Diagnosis not present

## 2017-05-28 DIAGNOSIS — R109 Unspecified abdominal pain: Secondary | ICD-10-CM | POA: Diagnosis not present

## 2017-05-28 DIAGNOSIS — N10 Acute tubulo-interstitial nephritis: Secondary | ICD-10-CM | POA: Diagnosis not present

## 2017-05-28 DIAGNOSIS — N179 Acute kidney failure, unspecified: Secondary | ICD-10-CM

## 2017-05-28 DIAGNOSIS — Z87891 Personal history of nicotine dependence: Secondary | ICD-10-CM | POA: Insufficient documentation

## 2017-05-28 LAB — CBC
HEMATOCRIT: 39.8 % (ref 36.0–46.0)
HEMOGLOBIN: 12.8 g/dL (ref 12.0–15.0)
MCH: 31.8 pg (ref 26.0–34.0)
MCHC: 32.2 g/dL (ref 30.0–36.0)
MCV: 98.8 fL (ref 78.0–100.0)
Platelets: 217 10*3/uL (ref 150–400)
RBC: 4.03 MIL/uL (ref 3.87–5.11)
RDW: 13.2 % (ref 11.5–15.5)
WBC: 8.9 10*3/uL (ref 4.0–10.5)

## 2017-05-28 LAB — URINALYSIS, ROUTINE W REFLEX MICROSCOPIC
BILIRUBIN URINE: NEGATIVE
GLUCOSE, UA: NEGATIVE mg/dL
Hgb urine dipstick: NEGATIVE
KETONES UR: NEGATIVE mg/dL
Nitrite: POSITIVE — AB
PROTEIN: NEGATIVE mg/dL
Specific Gravity, Urine: 1.017 (ref 1.005–1.030)
pH: 5 (ref 5.0–8.0)

## 2017-05-28 LAB — COMPREHENSIVE METABOLIC PANEL
ALBUMIN: 4 g/dL (ref 3.5–5.0)
ALK PHOS: 68 U/L (ref 38–126)
ALT: 14 U/L (ref 14–54)
ANION GAP: 12 (ref 5–15)
AST: 16 U/L (ref 15–41)
BUN: 15 mg/dL (ref 6–20)
CHLORIDE: 102 mmol/L (ref 101–111)
CO2: 24 mmol/L (ref 22–32)
Calcium: 9.2 mg/dL (ref 8.9–10.3)
Creatinine, Ser: 1.26 mg/dL — ABNORMAL HIGH (ref 0.44–1.00)
GFR calc Af Amer: 45 mL/min — ABNORMAL LOW (ref >60)
GFR calc non Af Amer: 39 mL/min — ABNORMAL LOW (ref >60)
Glucose, Bld: 204 mg/dL — ABNORMAL HIGH (ref 65–99)
POTASSIUM: 5 mmol/L (ref 3.5–5.1)
SODIUM: 138 mmol/L (ref 135–145)
Total Bilirubin: 0.5 mg/dL (ref 0.3–1.2)
Total Protein: 6.9 g/dL (ref 6.5–8.1)

## 2017-05-28 LAB — LIPASE, BLOOD: LIPASE: 20 U/L (ref 11–51)

## 2017-05-28 MED ORDER — CIPROFLOXACIN HCL 500 MG PO TABS: 500.0000 mg | ORAL_TABLET | Freq: Two times a day (BID) | ORAL | 0 refills | Status: DC

## 2017-05-28 MED ORDER — SODIUM CHLORIDE 0.9 % IV BOLUS
1000.0000 mL | Freq: Once | INTRAVENOUS | Status: AC
  Administered 2017-05-28: 1000 mL via INTRAVENOUS

## 2017-05-28 MED ORDER — MORPHINE SULFATE (PF) 4 MG/ML IV SOLN
4.0000 mg | Freq: Once | INTRAVENOUS | Status: AC
  Administered 2017-05-28: 4 mg via INTRAVENOUS
  Filled 2017-05-28: qty 1

## 2017-05-28 MED ORDER — CIPROFLOXACIN IN D5W 400 MG/200ML IV SOLN
400.0000 mg | Freq: Once | INTRAVENOUS | Status: AC
  Administered 2017-05-28: 400 mg via INTRAVENOUS
  Filled 2017-05-28: qty 200

## 2017-05-28 MED ORDER — ONDANSETRON 4 MG PO TBDP: 4.0000 mg | ORAL_TABLET | Freq: Three times a day (TID) | ORAL | 0 refills | Status: AC | PRN

## 2017-05-28 MED ORDER — ONDANSETRON HCL 4 MG/2ML IJ SOLN
4.0000 mg | Freq: Once | INTRAMUSCULAR | Status: AC
  Administered 2017-05-28: 4 mg via INTRAVENOUS
  Filled 2017-05-28: qty 2

## 2017-05-28 MED ORDER — ROPINIROLE HCL 1 MG PO TABS
1.0000 mg | ORAL_TABLET | Freq: Once | ORAL | Status: AC
  Administered 2017-05-28: 1 mg via ORAL
  Filled 2017-05-28: qty 1

## 2017-05-28 NOTE — Discharge Instructions (Addendum)
You have a urine infection that has traveled to the right kidney. You do have kidney stones in the kidneys, but these are not causing any problems at this time. Also, by the way, you DO NOT have an appendix, it appears to have been removed at some point in time.  Stay very well hydrated with plenty of water throughout the day. Use zofran as directed as needed for nausea. Take antibiotic until completed, starting tonight at bedtime since you were given your earlier dose here today in the ER. Alternate between tylenol and motrin as needed for pain, or use your home pain medications as needed for additional pain relief but don't drive or operate machinery while taking those medications. Follow up with your primary care physician in 5-7 days for recheck of ongoing symptoms but return to ER for emergent changing or worsening of symptoms. Please seek immediate care if you develop the following: You develop worsening back pain.  Your symptoms are no better, or worse in 3 days. There is severe back pain or lower abdominal pain.  You develop chills.  You have a fever.  There is nausea or vomiting.  There is continued burning or discomfort with urination.

## 2017-05-28 NOTE — ED Provider Notes (Signed)
[facility] Provider Note   CSN: [id number] Arrival date & time: 05/28/17  0941     History   Chief Complaint No chief complaint on file.   HPI Stephanie [facility] is a 83 y.o. female with a PMHx of asthma, DM2, HTN, and kidney stones, with a PSHx of hysterectomy and gastric banding, who presents to the ED with complaints of 3 days of gradually worsening right flank pain, nausea, and vomiting.  Patient states that she has had 10 kidney stones in the past, and thinks that she could be passing another kidney stone.  She describes the pain as 10/10 constant aching with intermittent sharp right flank pain that radiates into the right lower quadrant, worse with activity and movement, and unrelieved with Advil and Percocet 7.5 mg.  She reports associated nausea, 5 episodes daily of nonbloody nonbilious emesis, and malodorous urine.  She denies fevers, chills, CP, SOB, diarrhea/constipation, melena, hematochezia, hematemesis, hematuria, dysuria, urinary frequency, vaginal bleeding/discharge, myalgias, arthralgias, numbness, tingling, focal weakness, or any other complaints at this time. Denies recent travel, sick contacts, suspicious food intake, EtOH use, or frequent NSAID use.  Pt states she's allergic to PCN and sulfa antibiotics, reports that her doctor told her "she would die" if she ever received PCN; she's not exactly sure of what type of reaction she has to it but was told never to take it.  She unsure about cephalosporins.  She's also not sure if she has her appendix, knows she's had a hysterectomy but doesn't know if she ever had her appendix removed.   The history is provided by the patient and medical records. No language interpreter was used.  Flank Pain  This is a new problem. The current episode started more than 2 days ago. The problem occurs constantly. The problem has been gradually worsening. Associated symptoms include abdominal pain. Pertinent  negatives include no chest pain and no shortness of breath. Exacerbated by: activity. Nothing relieves the symptoms. Treatments tried: percocet 7.5mg  and advil. The treatment provided no relief.    Past Medical History:  Diagnosis Date  . Arthritis   . Asthma   . Blood transfusion without reported diagnosis   . Cancer ([place])   . Diabetes mellitus without complication ([place])   . Hypertension   . Renal disorder     There are no active problems to display for this patient.   Past Surgical History:  Procedure Laterality Date  . ABDOMINAL HYSTERECTOMY    . BLADDER SURGERY    . JOINT REPLACEMENT    . LAPAROSCOPIC GASTRIC BANDING    . PAROTID GLAND TUMOR EXCISION    . SKIN BIOPSY    . TUBAL LIGATION       OB History   None      Home Medications    Prior to Admission medications   Medication Sig Start Date End Date Taking? Authorizing Provider  acetaminophen (TYLENOL) 500 MG tablet Take 1,000 mg by mouth every 6 (six) hours as needed for pain.    [provider]  albuterol (PROVENTIL HFA;VENTOLIN HFA) 108 (90 BASE) MCG/ACT inhaler Inhale 2 puffs into the lungs every 6 (six) hours as needed for wheezing or shortness of breath.    [provider]  albuterol (PROVENTIL HFA;VENTOLIN HFA) 108 (90 BASE) MCG/ACT inhaler Inhale 1-2 puffs into the lungs every 6 (six) hours as needed for wheezing. 11/01/12   Teressa Lower, MD  albuterol (PROVENTIL) (2.5 MG/3ML) 0.083% nebulizer solution Take 2.5 mg by  nebulization every 6 (six) hours as needed for wheezing.    [provider]  azithromycin (ZITHROMAX) 250 MG tablet Take 1 tablet (250 mg total) by mouth daily. Take first 2 tablets together, then 1 every day until finished. 11/09/12   Horton, Barbette Hair, MD  Biotin 5000 MCG CAPS Take 1 capsule by mouth daily.    [provider]  Cholecalciferol (VITAMIN D-3 PO) Take 1 tablet by mouth daily.    [provider]  Cyanocobalamin (B-12 PO) Place 1 tablet  under the tongue daily.    [provider]  cyclobenzaprine (FLEXERIL) 5 MG tablet Take 5 mg by mouth 3 (three) times daily as needed for muscle spasms.    [provider]  Fluticasone-Salmeterol (ADVAIR) 250-50 MCG/DOSE AEPB Inhale 1 puff into the lungs 2 (two) times daily as needed (for shortness of breath).    [provider]  furosemide (LASIX) 40 MG tablet Take 40 mg by mouth daily as needed (for swelling).    [provider]  gabapentin (NEURONTIN) 300 MG capsule Take 300 mg by mouth 3 (three) times daily.    [provider]  glipiZIDE (GLUCOTROL) 10 MG tablet Take 5 mg by mouth daily.    [provider]  lisinopril (PRINIVIL,ZESTRIL) 10 MG tablet Take 10 mg by mouth daily.    [provider]  metFORMIN (GLUCOPHAGE) 1000 MG tablet Take 1,000 mg by mouth at bedtime as needed (for elevated blood sugar- >140).     [provider]  metoprolol succinate (TOPROL-XL) 50 MG 24 hr tablet Take 50 mg by mouth daily. Take with or immediately following a meal.    [provider]  Multiple Vitamin (MULTIVITAMIN WITH MINERALS) TABS Take 1 tablet by mouth daily.    [provider]  potassium chloride SA (K-DUR,KLOR-CON) 20 MEQ tablet Take 20 mEq by mouth daily.    [provider]  predniSONE (DELTASONE) 20 MG tablet Take 3 tablets (60 mg total) by mouth daily. 11/01/12   Teressa Lower, MD  predniSONE (DELTASONE) 50 MG tablet Take 1 tablet (50 mg total) by mouth daily. 11/09/12   Horton, Barbette Hair, MD  rOPINIRole (REQUIP) 1 MG tablet Take 1 mg by mouth every morning.    [provider]  rOPINIRole (REQUIP) 3 MG tablet Take 3 mg by mouth at bedtime.    [provider]  simvastatin (ZOCOR) 20 MG tablet Take 20 mg by mouth daily.    [provider]  tiotropium (SPIRIVA) 18 MCG inhalation capsule Place 18 mcg into inhaler and inhale daily as needed (for shortness of breath).    [provider]  traMADol (ULTRAM) 50 MG tablet Take 50 mg by mouth every 6 (six) hours as needed for pain.    [provider]    Family History No family history on file.  Social History Social History   Tobacco Use  . Smoking status: Former Smoker  Substance Use Topics  . Alcohol use: Yes    Comment: RARELY  . Drug use: No     Allergies   Penicillins; Sulfa antibiotics; and Codeine   Review of Systems Review of Systems  Constitutional: Negative for chills and fever.  Respiratory: Negative for shortness of breath.   Cardiovascular: Negative for chest pain.  Gastrointestinal: Positive for abdominal pain, nausea and vomiting. Negative for blood in stool, constipation and diarrhea.  Genitourinary: Positive for flank pain. Negative for dysuria, frequency, hematuria, vaginal bleeding and vaginal discharge.       +  malodorous urine  Musculoskeletal: Negative for arthralgias and myalgias.  Skin: Negative for color change.  Allergic/Immunologic: Positive for immunocompromised state (DM2).  Neurological: Negative for weakness and numbness.  Psychiatric/Behavioral: Negative for confusion.   All other systems reviewed and are negative for acute change except as noted in the HPI.    Physical Exam Updated Vital Signs BP 139/74 (BP Location: Right Wrist)   Pulse 70   Temp 98 F (36.7 C) (Oral)   Resp 20   SpO2 95%   Physical Exam  Constitutional: She is oriented to person, place, and time. Vital signs are normal. She appears well-developed and well-nourished.  Non-toxic appearance. No distress.  Afebrile, nontoxic, NAD  HENT:  Head: Normocephalic and atraumatic.  Mouth/Throat: Oropharynx is clear and moist and mucous membranes are normal.  Eyes: Conjunctivae and EOM are normal. Right eye exhibits no discharge. Left eye exhibits no discharge.  Neck: Normal range of motion. Neck supple.  Cardiovascular: Normal rate, regular rhythm, normal heart sounds and intact distal  pulses. Exam reveals no gallop and no friction rub.  No murmur heard. Pulmonary/Chest: Effort normal and breath sounds normal. No respiratory distress. She has no decreased breath sounds. She has no wheezes. She has no rhonchi. She has no rales.  Abdominal: Soft. Normal appearance and bowel sounds are normal. She exhibits no distension. There is tenderness in the right upper quadrant and right lower quadrant. There is CVA tenderness. There is no rigidity, no rebound, no guarding, no tenderness at McBurney's point and negative Murphy's sign.    Soft, obese but nondistended, +BS throughout, with moderate R lateral abdomen TTP in RUQ/RLQ and tracking towards the R flank area, no r/g/r, neg murphy's, no focal mcburney's point tenderness although she is tender diffusely in the RUQ/RLQ and R lateral abdomen tracking into the flank; +R sided CVA TTP   Musculoskeletal: Normal range of motion.  Neurological: She is alert and oriented to person, place, and time. She has normal strength. No sensory deficit.  Skin: Skin is warm, dry and intact. No rash noted.  Psychiatric: She has a normal mood and affect.  Nursing note and vitals reviewed.    ED Treatments / Results  Labs (all labs ordered are listed, but only abnormal results are displayed) Labs Reviewed  COMPREHENSIVE METABOLIC PANEL - Abnormal; Notable for the following components:      Result Value   Glucose, Bld 204 (*)    Creatinine, Ser 1.26 (*)    GFR calc non Af Amer 39 (*)    GFR calc Af Amer 45 (*)    All other components within normal limits  URINALYSIS, ROUTINE W REFLEX MICROSCOPIC - Abnormal; Notable for the following components:   Nitrite POSITIVE (*)    Leukocytes, UA TRACE (*)    Bacteria, UA MANY (*)    Squamous Epithelial / LPF 0-5 (*)    All other components within normal limits  URINE CULTURE  LIPASE, BLOOD  CBC    EKG None  Radiology Ct Renal Stone Study  Result Date: 05/28/2017 CLINICAL DATA:  Right flank pain  for 3 days. Recurrent stone disease suspected. EXAM: CT ABDOMEN AND PELVIS WITHOUT CONTRAST TECHNIQUE: Multidetector CT imaging of the abdomen and pelvis was performed following the standard protocol without IV contrast. COMPARISON:  None. FINDINGS: Lower chest: The lung bases are clear without focal nodule, mass, or airspace disease. Heart size is normal. Hepatobiliary: No focal liver abnormality is seen. No gallstones, gallbladder wall thickening, or biliary dilatation. Pancreas: Unremarkable.  Pancreas is atrophic. No focal lesions are present. There is no duct dilation. Spleen: Normal in size without focal abnormality. Adrenals/Urinary Tract: The adrenal glands are normal bilaterally. For punctate nonobstructing stones are present in the right kidney. The right ureter is within normal limits. At least 6 separate stones are present in the left kidney. The largest is at the lower pole measuring 6 mm. The left ureter is unremarkable. There is no obstruction or inflammation. No other mass lesion is present. The urinary bladder wall is thickened without a discrete mass. Stomach/Bowel: A gastric band is in place. No complicating features are evident. Stomach is within normal limits. Small bowel is unremarkable. The appendix appears to be surgically absent. No inflammatory changes are present. Terminal ileum is within normal limits. Ascending and transverse colon are within normal limits. Descending colon is unremarkable. Diverticular changes are present in the sigmoid colon without focal inflammation to suggest diverticulitis. Vascular/Lymphatic: Atherosclerotic calcifications are present without aneurysm. No significant adenopathy is present. Reproductive: Status post hysterectomy. No adnexal masses. Other: No abdominal wall hernia or abnormality. No abdominopelvic ascites. Musculoskeletal: Multilevel degenerative changes are present within the thoracolumbar spine. Levoconvex curvature is centered at L4. There is  rightward curvature of the thoracolumbar spine. Endplate sclerotic changes are most apparent at L1-2 and L2-3. There is a vacuum disc and endplate sclerotic changes at L5-S1. No focal lytic or blastic lesions are present. The bony pelvis is within normal limits. Sclerotic changes of the right acetabulum appears degenerative. IMPRESSION: 1. Bilateral nephrolithiasis without obstruction or inflammatory change to suggest recent passage of a stone. 2. Diffuse bladder wall thickening compatible with urinary tract infection. 3. Sigmoid diverticulosis without diverticulitis. 4. Multilevel degenerative changes of the thoracolumbar spine as described. 5. Asymmetric degenerative changes at the right hip. Electronically Signed   By: San Morelle M.D.   On: 05/28/2017 16:34    Procedures Procedures (including critical care time)  Medications Ordered in ED Medications  ondansetron (ZOFRAN) injection 4 mg (4 mg Intravenous Given 05/28/17 1817)  sodium chloride 0.9 % bolus 1,000 mL (0 mLs Intravenous Stopped 05/28/17 1919)  morphine 4 MG/ML injection 4 mg (4 mg Intravenous Given 05/28/17 1816)  ciprofloxacin (CIPRO) IVPB 400 mg (0 mg Intravenous Stopped 05/28/17 1919)  rOPINIRole (REQUIP) tablet 1 mg (1 mg Oral Given 05/28/17 1917)     Initial Impression / Assessment and Plan / ED Course  I have reviewed the triage vital signs and the nursing notes.  Pertinent labs & imaging results that were available during my care of the patient were reviewed by me and considered in my medical decision making (see chart for details).     3:12 PM-pt still not roomed; advised nursing staff.  3:38 PM- 83 y.o. female here with 3 days of R flank pain radiating to RLQ with associated n/v and malodorous urine. She's had kidney stones in the past and wonders whether she could be passing one. On exam, moderate R sided abdominal TTP, nonperitoneal, +R CVA TTP, no focal mcburney's point tenderness however she's diffusely tender  along the R abdomen tracking towards the right flank. Work up thus far reveals: lipase WNL; CMP with mildly elevated Cr 1.26, gluc 204 but no anion gap; CBC WNL; U/A with +nitrites +leuks 0-5 WBC and RBCs and squamous, with many bacteria. Overall consistent with UTI/pyelonephritis, although concern for possible stone still exists as well. DDx also could include appendicitis although very unlikely. Will give fluids, zofran, pain meds, and abx, and obtain CT renal study  to eval for kidney stone and look at appendix as well. UCx also sent. Will reassess shortly. Discussed case with my attending Dr. Rex Kras who agrees with plan.   4:49 PM CT renal study with b/l nephrolithiasis in the kidneys but none in the ureters or otherwise, diffuse bladder wall thickening suggestive of UTI, also with surgically absent appendix, and sigmoid diverticulosis without diverticulitis; overall consistent with suspected pyelo, no infected stone appears to be present. Pt has yet to receive her meds, will reassess after these are given.   8:00 PM Pt feeling much better and tolerating PO well. Will send home with abx to treat pyelonephritis, and zofran; advised adequate hydration. Discussed tylenol/motrin/home pain med use, and f/up with PCP in 5-7 days for recheck of symptoms. I explained the diagnosis and have given explicit precautions to return to the ER including for any other new or worsening symptoms. The patient understands and accepts the medical plan as it's been dictated and I have answered their questions. Discharge instructions concerning home care and prescriptions have been given. The patient is STABLE and is discharged to home in good condition.    Final Clinical Impressions(s) / ED Diagnoses   Final diagnoses:  Pyelonephritis  Right flank pain  Nausea and vomiting in adult patient  AKI (acute kidney injury) ([place])  Type 2 diabetes mellitus with hyperglycemia, without long-term current use of insulin [facility])     ED Discharge Orders        Ordered    ondansetron (ZOFRAN ODT) 4 MG disintegrating tablet  Every 8 hours PRN     05/28/17 1923    ciprofloxacin (CIPRO) 500 MG tablet  2 times daily     05/28/17 [address] Deont[address] Massi, [place], [place] 05/28/17 2000    Little, Wenda Overland, MD 05/29/17 2124

## 2017-05-28 NOTE — ED Triage Notes (Signed)
Patient complains of abdominal pain with vomiting and radiation to right flank x 3 days, hx of kidney stones, no diarrhea

## 2017-05-30 LAB — URINE CULTURE: Culture: 50000 — AB

## 2017-05-31 ENCOUNTER — Telehealth: Payer: Self-pay

## 2017-05-31 NOTE — Telephone Encounter (Signed)
Post ED Visit - Positive Culture Follow-up  Culture report reviewed by antimicrobial stewardship pharmacist:  []  Elenor Quinones, Pharm.D. []  Heide Guile, Pharm.D., BCPS AQ-ID []  Parks Neptune, Pharm.D., BCPS []  Alycia Rossetti, Pharm.D., BCPS []  [place], Pharm.D., BCPS, AAHIVP []  Legrand Como, Pharm.D., BCPS, AAHIVP []  Salome Arnt, PharmD, BCPS []  Jalene Mullet, PharmD []  Vincenza Hews, PharmD, BCPS [facility] Pharm D Positive urine culture Treated with Ciprofloxacin, organism sensitive to the same and no further patient follow-up is required at this time.  Genia Del 05/31/2017, 10:41 AM

## 2017-06-09 DIAGNOSIS — Z79891 Long term (current) use of opiate analgesic: Secondary | ICD-10-CM | POA: Diagnosis not present

## 2017-06-09 DIAGNOSIS — M545 Low back pain: Secondary | ICD-10-CM | POA: Diagnosis not present

## 2017-06-09 DIAGNOSIS — G894 Chronic pain syndrome: Secondary | ICD-10-CM | POA: Diagnosis not present

## 2017-06-09 DIAGNOSIS — Z79899 Other long term (current) drug therapy: Secondary | ICD-10-CM | POA: Diagnosis not present

## 2017-06-10 ENCOUNTER — Other Ambulatory Visit: Payer: Self-pay | Admitting: Pain Medicine

## 2017-06-10 DIAGNOSIS — M545 Low back pain: Secondary | ICD-10-CM

## 2017-06-17 DIAGNOSIS — R69 Illness, unspecified: Secondary | ICD-10-CM | POA: Diagnosis not present

## 2017-06-17 DIAGNOSIS — G894 Chronic pain syndrome: Secondary | ICD-10-CM | POA: Diagnosis not present

## 2017-06-17 DIAGNOSIS — M792 Neuralgia and neuritis, unspecified: Secondary | ICD-10-CM | POA: Diagnosis not present

## 2017-06-17 DIAGNOSIS — G609 Hereditary and idiopathic neuropathy, unspecified: Secondary | ICD-10-CM | POA: Diagnosis not present

## 2017-09-02 DIAGNOSIS — L402 Acrodermatitis continua: Secondary | ICD-10-CM | POA: Diagnosis not present

## 2017-09-24 ENCOUNTER — Ambulatory Visit
Admission: RE | Admit: 2017-09-24 | Discharge: 2017-09-24 | Disposition: A | Discharge status: Home / Self Care | Payer: Medicare HMO | Source: Ambulatory Visit | Attending: Pain Medicine | Admitting: Pain Medicine

## 2017-09-24 DIAGNOSIS — M545 Low back pain: Secondary | ICD-10-CM

## 2017-09-24 DIAGNOSIS — M48061 Spinal stenosis, lumbar region without neurogenic claudication: Secondary | ICD-10-CM | POA: Diagnosis not present

## 2017-09-24 DIAGNOSIS — E119 Type 2 diabetes mellitus without complications: Secondary | ICD-10-CM | POA: Diagnosis not present

## 2017-09-24 DIAGNOSIS — Z79899 Other long term (current) drug therapy: Secondary | ICD-10-CM | POA: Diagnosis not present

## 2017-09-29 DIAGNOSIS — Z79899 Other long term (current) drug therapy: Secondary | ICD-10-CM | POA: Diagnosis not present

## 2017-09-29 DIAGNOSIS — M5136 Other intervertebral disc degeneration, lumbar region: Secondary | ICD-10-CM | POA: Diagnosis not present

## 2017-09-29 DIAGNOSIS — Z79891 Long term (current) use of opiate analgesic: Secondary | ICD-10-CM | POA: Diagnosis not present

## 2017-09-29 DIAGNOSIS — G894 Chronic pain syndrome: Secondary | ICD-10-CM | POA: Diagnosis not present

## 2017-09-29 DIAGNOSIS — M47817 Spondylosis without myelopathy or radiculopathy, lumbosacral region: Secondary | ICD-10-CM | POA: Diagnosis not present

## 2017-10-01 DIAGNOSIS — R69 Illness, unspecified: Secondary | ICD-10-CM | POA: Diagnosis not present

## 2017-10-01 DIAGNOSIS — E1142 Type 2 diabetes mellitus with diabetic polyneuropathy: Secondary | ICD-10-CM | POA: Diagnosis not present

## 2017-10-01 DIAGNOSIS — J45909 Unspecified asthma, uncomplicated: Secondary | ICD-10-CM | POA: Diagnosis not present

## 2017-10-01 DIAGNOSIS — E162 Hypoglycemia, unspecified: Secondary | ICD-10-CM | POA: Diagnosis not present

## 2017-10-01 DIAGNOSIS — E119 Type 2 diabetes mellitus without complications: Secondary | ICD-10-CM | POA: Diagnosis not present

## 2017-10-01 DIAGNOSIS — G8929 Other chronic pain: Secondary | ICD-10-CM | POA: Diagnosis not present

## 2017-10-06 DIAGNOSIS — M47817 Spondylosis without myelopathy or radiculopathy, lumbosacral region: Secondary | ICD-10-CM | POA: Diagnosis not present

## 2017-10-29 DIAGNOSIS — R69 Illness, unspecified: Secondary | ICD-10-CM | POA: Diagnosis not present

## 2017-11-10 DIAGNOSIS — M47817 Spondylosis without myelopathy or radiculopathy, lumbosacral region: Secondary | ICD-10-CM | POA: Diagnosis not present

## 2017-11-11 DIAGNOSIS — E119 Type 2 diabetes mellitus without complications: Secondary | ICD-10-CM | POA: Diagnosis not present

## 2017-11-26 DIAGNOSIS — R69 Illness, unspecified: Secondary | ICD-10-CM | POA: Diagnosis not present

## 2017-12-15 DIAGNOSIS — M47817 Spondylosis without myelopathy or radiculopathy, lumbosacral region: Secondary | ICD-10-CM | POA: Diagnosis not present

## 2018-01-07 DIAGNOSIS — M549 Dorsalgia, unspecified: Secondary | ICD-10-CM | POA: Diagnosis not present

## 2018-01-07 DIAGNOSIS — M2550 Pain in unspecified joint: Secondary | ICD-10-CM | POA: Diagnosis not present

## 2018-01-07 DIAGNOSIS — M79642 Pain in left hand: Secondary | ICD-10-CM | POA: Diagnosis not present

## 2018-01-07 DIAGNOSIS — M791 Myalgia, unspecified site: Secondary | ICD-10-CM | POA: Diagnosis not present

## 2018-01-07 DIAGNOSIS — M79671 Pain in right foot: Secondary | ICD-10-CM | POA: Diagnosis not present

## 2018-01-07 DIAGNOSIS — M79641 Pain in right hand: Secondary | ICD-10-CM | POA: Diagnosis not present

## 2018-01-07 DIAGNOSIS — M79672 Pain in left foot: Secondary | ICD-10-CM | POA: Diagnosis not present

## 2018-01-07 DIAGNOSIS — M1990 Unspecified osteoarthritis, unspecified site: Secondary | ICD-10-CM | POA: Diagnosis not present

## 2018-01-07 DIAGNOSIS — M359 Systemic involvement of connective tissue, unspecified: Secondary | ICD-10-CM | POA: Diagnosis not present

## 2018-01-12 DIAGNOSIS — Z79891 Long term (current) use of opiate analgesic: Secondary | ICD-10-CM | POA: Diagnosis not present

## 2018-01-12 DIAGNOSIS — Z79899 Other long term (current) drug therapy: Secondary | ICD-10-CM | POA: Diagnosis not present

## 2018-01-12 DIAGNOSIS — M47817 Spondylosis without myelopathy or radiculopathy, lumbosacral region: Secondary | ICD-10-CM | POA: Diagnosis not present

## 2018-01-12 DIAGNOSIS — G894 Chronic pain syndrome: Secondary | ICD-10-CM | POA: Diagnosis not present

## 2018-01-19 DIAGNOSIS — Z0000 Encounter for general adult medical examination without abnormal findings: Secondary | ICD-10-CM | POA: Diagnosis not present

## 2018-01-19 DIAGNOSIS — E119 Type 2 diabetes mellitus without complications: Secondary | ICD-10-CM | POA: Diagnosis not present

## 2018-01-19 DIAGNOSIS — J45909 Unspecified asthma, uncomplicated: Secondary | ICD-10-CM | POA: Diagnosis not present

## 2018-01-19 DIAGNOSIS — R197 Diarrhea, unspecified: Secondary | ICD-10-CM | POA: Diagnosis not present

## 2018-01-19 DIAGNOSIS — G8929 Other chronic pain: Secondary | ICD-10-CM | POA: Diagnosis not present

## 2018-01-19 DIAGNOSIS — Z23 Encounter for immunization: Secondary | ICD-10-CM | POA: Diagnosis not present

## 2018-01-20 DIAGNOSIS — M1990 Unspecified osteoarthritis, unspecified site: Secondary | ICD-10-CM | POA: Diagnosis not present

## 2018-01-20 DIAGNOSIS — M791 Myalgia, unspecified site: Secondary | ICD-10-CM | POA: Diagnosis not present

## 2018-01-20 DIAGNOSIS — M359 Systemic involvement of connective tissue, unspecified: Secondary | ICD-10-CM | POA: Diagnosis not present

## 2018-01-20 DIAGNOSIS — M549 Dorsalgia, unspecified: Secondary | ICD-10-CM | POA: Diagnosis not present

## 2018-01-20 DIAGNOSIS — R197 Diarrhea, unspecified: Secondary | ICD-10-CM | POA: Diagnosis not present

## 2018-01-20 DIAGNOSIS — M2550 Pain in unspecified joint: Secondary | ICD-10-CM | POA: Diagnosis not present

## 2018-01-26 DIAGNOSIS — E1142 Type 2 diabetes mellitus with diabetic polyneuropathy: Secondary | ICD-10-CM | POA: Diagnosis not present

## 2018-01-26 DIAGNOSIS — R1011 Right upper quadrant pain: Secondary | ICD-10-CM | POA: Diagnosis not present

## 2018-01-26 DIAGNOSIS — I129 Hypertensive chronic kidney disease with stage 1 through stage 4 chronic kidney disease, or unspecified chronic kidney disease: Secondary | ICD-10-CM | POA: Diagnosis not present

## 2018-01-26 DIAGNOSIS — Z0000 Encounter for general adult medical examination without abnormal findings: Secondary | ICD-10-CM | POA: Diagnosis not present

## 2018-01-26 DIAGNOSIS — N183 Chronic kidney disease, stage 3 (moderate): Secondary | ICD-10-CM | POA: Diagnosis not present

## 2018-01-26 DIAGNOSIS — J45909 Unspecified asthma, uncomplicated: Secondary | ICD-10-CM | POA: Diagnosis not present

## 2018-01-26 DIAGNOSIS — R490 Dysphonia: Secondary | ICD-10-CM | POA: Diagnosis not present

## 2018-01-26 DIAGNOSIS — E782 Mixed hyperlipidemia: Secondary | ICD-10-CM | POA: Diagnosis not present

## 2018-01-26 DIAGNOSIS — M359 Systemic involvement of connective tissue, unspecified: Secondary | ICD-10-CM | POA: Diagnosis not present

## 2018-01-26 DIAGNOSIS — E1165 Type 2 diabetes mellitus with hyperglycemia: Secondary | ICD-10-CM | POA: Diagnosis not present

## 2018-02-09 DIAGNOSIS — M961 Postlaminectomy syndrome, not elsewhere classified: Secondary | ICD-10-CM | POA: Diagnosis not present

## 2018-02-09 DIAGNOSIS — M5136 Other intervertebral disc degeneration, lumbar region: Secondary | ICD-10-CM | POA: Diagnosis not present

## 2018-02-09 DIAGNOSIS — G894 Chronic pain syndrome: Secondary | ICD-10-CM | POA: Diagnosis not present

## 2018-02-09 DIAGNOSIS — M47817 Spondylosis without myelopathy or radiculopathy, lumbosacral region: Secondary | ICD-10-CM | POA: Diagnosis not present

## 2018-02-13 DIAGNOSIS — M359 Systemic involvement of connective tissue, unspecified: Secondary | ICD-10-CM | POA: Diagnosis not present

## 2018-02-13 DIAGNOSIS — M791 Myalgia, unspecified site: Secondary | ICD-10-CM | POA: Diagnosis not present

## 2018-02-13 DIAGNOSIS — M549 Dorsalgia, unspecified: Secondary | ICD-10-CM | POA: Diagnosis not present

## 2018-02-13 DIAGNOSIS — R300 Dysuria: Secondary | ICD-10-CM | POA: Diagnosis not present

## 2018-02-13 DIAGNOSIS — E119 Type 2 diabetes mellitus without complications: Secondary | ICD-10-CM | POA: Diagnosis not present

## 2018-02-13 DIAGNOSIS — M8580 Other specified disorders of bone density and structure, unspecified site: Secondary | ICD-10-CM | POA: Diagnosis not present

## 2018-02-13 DIAGNOSIS — N183 Chronic kidney disease, stage 3 (moderate): Secondary | ICD-10-CM | POA: Diagnosis not present

## 2018-02-13 DIAGNOSIS — N390 Urinary tract infection, site not specified: Secondary | ICD-10-CM | POA: Diagnosis not present

## 2018-02-13 DIAGNOSIS — M8589 Other specified disorders of bone density and structure, multiple sites: Secondary | ICD-10-CM | POA: Diagnosis not present

## 2018-02-13 DIAGNOSIS — M1990 Unspecified osteoarthritis, unspecified site: Secondary | ICD-10-CM | POA: Diagnosis not present

## 2018-02-13 DIAGNOSIS — M859 Disorder of bone density and structure, unspecified: Secondary | ICD-10-CM | POA: Diagnosis not present

## 2018-02-13 DIAGNOSIS — M2550 Pain in unspecified joint: Secondary | ICD-10-CM | POA: Diagnosis not present

## 2018-02-13 DIAGNOSIS — M109 Gout, unspecified: Secondary | ICD-10-CM | POA: Diagnosis not present

## 2018-02-14 DIAGNOSIS — E119 Type 2 diabetes mellitus without complications: Secondary | ICD-10-CM | POA: Diagnosis not present

## 2018-02-27 DIAGNOSIS — M19071 Primary osteoarthritis, right ankle and foot: Secondary | ICD-10-CM | POA: Diagnosis not present

## 2018-02-27 DIAGNOSIS — M5136 Other intervertebral disc degeneration, lumbar region: Secondary | ICD-10-CM | POA: Diagnosis not present

## 2018-02-27 DIAGNOSIS — M19011 Primary osteoarthritis, right shoulder: Secondary | ICD-10-CM | POA: Diagnosis not present

## 2018-02-27 DIAGNOSIS — M19031 Primary osteoarthritis, right wrist: Secondary | ICD-10-CM | POA: Diagnosis not present

## 2018-02-27 DIAGNOSIS — M19032 Primary osteoarthritis, left wrist: Secondary | ICD-10-CM | POA: Diagnosis not present

## 2018-02-27 DIAGNOSIS — M19072 Primary osteoarthritis, left ankle and foot: Secondary | ICD-10-CM | POA: Diagnosis not present

## 2018-03-03 DIAGNOSIS — B9689 Other specified bacterial agents as the cause of diseases classified elsewhere: Secondary | ICD-10-CM | POA: Diagnosis not present

## 2018-03-03 DIAGNOSIS — G8929 Other chronic pain: Secondary | ICD-10-CM | POA: Diagnosis not present

## 2018-03-03 DIAGNOSIS — G2581 Restless legs syndrome: Secondary | ICD-10-CM | POA: Diagnosis not present

## 2018-03-03 DIAGNOSIS — N390 Urinary tract infection, site not specified: Secondary | ICD-10-CM | POA: Diagnosis not present

## 2018-03-03 DIAGNOSIS — K219 Gastro-esophageal reflux disease without esophagitis: Secondary | ICD-10-CM | POA: Diagnosis not present

## 2018-03-03 DIAGNOSIS — Z87442 Personal history of urinary calculi: Secondary | ICD-10-CM | POA: Diagnosis not present

## 2018-03-03 DIAGNOSIS — E119 Type 2 diabetes mellitus without complications: Secondary | ICD-10-CM | POA: Diagnosis not present

## 2018-03-03 DIAGNOSIS — Z859 Personal history of malignant neoplasm, unspecified: Secondary | ICD-10-CM | POA: Diagnosis not present

## 2018-03-03 DIAGNOSIS — N200 Calculus of kidney: Secondary | ICD-10-CM | POA: Diagnosis not present

## 2018-03-03 DIAGNOSIS — R109 Unspecified abdominal pain: Secondary | ICD-10-CM | POA: Diagnosis not present

## 2018-03-03 DIAGNOSIS — I10 Essential (primary) hypertension: Secondary | ICD-10-CM | POA: Diagnosis not present

## 2018-03-03 DIAGNOSIS — R1030 Lower abdominal pain, unspecified: Secondary | ICD-10-CM | POA: Diagnosis not present

## 2018-03-03 DIAGNOSIS — E785 Hyperlipidemia, unspecified: Secondary | ICD-10-CM | POA: Diagnosis not present

## 2018-03-07 DIAGNOSIS — M19011 Primary osteoarthritis, right shoulder: Secondary | ICD-10-CM | POA: Diagnosis not present

## 2018-03-07 DIAGNOSIS — M19071 Primary osteoarthritis, right ankle and foot: Secondary | ICD-10-CM | POA: Diagnosis not present

## 2018-03-07 DIAGNOSIS — M19032 Primary osteoarthritis, left wrist: Secondary | ICD-10-CM | POA: Diagnosis not present

## 2018-03-07 DIAGNOSIS — M19031 Primary osteoarthritis, right wrist: Secondary | ICD-10-CM | POA: Diagnosis not present

## 2018-03-07 DIAGNOSIS — M5136 Other intervertebral disc degeneration, lumbar region: Secondary | ICD-10-CM | POA: Diagnosis not present

## 2018-03-07 DIAGNOSIS — M19072 Primary osteoarthritis, left ankle and foot: Secondary | ICD-10-CM | POA: Diagnosis not present

## 2018-03-10 ENCOUNTER — Other Ambulatory Visit: Payer: Self-pay | Admitting: Pain Medicine

## 2018-03-10 ENCOUNTER — Ambulatory Visit
Admission: RE | Admit: 2018-03-10 | Discharge: 2018-03-10 | Disposition: A | Discharge status: Home / Self Care | Payer: Medicare HMO | Source: Ambulatory Visit | Attending: Pain Medicine | Admitting: Pain Medicine

## 2018-03-10 DIAGNOSIS — M542 Cervicalgia: Secondary | ICD-10-CM

## 2018-03-10 DIAGNOSIS — M47812 Spondylosis without myelopathy or radiculopathy, cervical region: Secondary | ICD-10-CM | POA: Diagnosis not present

## 2018-03-18 DIAGNOSIS — G894 Chronic pain syndrome: Secondary | ICD-10-CM | POA: Diagnosis not present

## 2018-03-18 DIAGNOSIS — M47817 Spondylosis without myelopathy or radiculopathy, lumbosacral region: Secondary | ICD-10-CM | POA: Diagnosis not present

## 2018-03-18 DIAGNOSIS — M47812 Spondylosis without myelopathy or radiculopathy, cervical region: Secondary | ICD-10-CM | POA: Diagnosis not present

## 2018-03-28 DIAGNOSIS — R69 Illness, unspecified: Secondary | ICD-10-CM | POA: Diagnosis not present

## 2018-04-01 DIAGNOSIS — M47812 Spondylosis without myelopathy or radiculopathy, cervical region: Secondary | ICD-10-CM | POA: Diagnosis not present

## 2018-05-06 DIAGNOSIS — M47812 Spondylosis without myelopathy or radiculopathy, cervical region: Secondary | ICD-10-CM | POA: Diagnosis not present

## 2018-05-08 ENCOUNTER — Encounter: Payer: Self-pay | Admitting: *Deleted

## 2018-05-11 ENCOUNTER — Telehealth: Payer: Self-pay | Admitting: Diagnostic Neuroimaging

## 2018-05-11 ENCOUNTER — Encounter: Payer: Self-pay | Admitting: Diagnostic Neuroimaging

## 2018-05-11 ENCOUNTER — Ambulatory Visit (INDEPENDENT_AMBULATORY_CARE_PROVIDER_SITE_OTHER): Payer: Medicare HMO | Admitting: Diagnostic Neuroimaging

## 2018-05-11 VITALS — BP 108/68 | HR 88 | Ht 60.0 in | Wt 195.6 lb

## 2018-05-11 DIAGNOSIS — H532 Diplopia: Secondary | ICD-10-CM

## 2018-05-11 DIAGNOSIS — R519 Headache, unspecified: Secondary | ICD-10-CM

## 2018-05-11 DIAGNOSIS — R51 Headache: Secondary | ICD-10-CM

## 2018-05-11 NOTE — Progress Notes (Signed)
[facility]  PATIENT: Stephanie Rivers DOB: March 06, 1934  REFERRING CLINICIAN: D Spivey HISTORY FROM: patient and son in law REASON FOR VISIT: new consult    HISTORICAL  CHIEF COMPLAINT:  Chief Complaint  Patient presents with  . Headache    rm 7 New Pt, son-in-law- Eddie Dibbles   . Falls    HISTORY OF PRESENT ILLNESS:   84 year old female here for evaluation of headaches and syncope.  Patient has diabetes, hypercholesterolemia, rheumatoid arthritis, gout, chronic pain syndrome.  Patient reports onset of headaches 2 to 3 months ago with global pain, blurred vision; no nausea, sensitivity to light or sound.  She describes a generalized squeezing sensation.  No other associated, alleviating or aggravating factors.  Several years ago patient was having intermittent episodes of unresponsiveness, slumping, lasting a few minutes and then waking back up.  These occurred in [place].  Patient thinks she was diagnosed with seizures versus passing out spells.  She was treated with clonazepam twice a day which seemed to resolve the episodes.  Last episode occurred in 2017.   REVIEW OF SYSTEMS: Full 14 system review of systems performed and negative with exception of: Sleepiness snoring memory loss dizziness decreased energy joint pain cramps diarrhea spinning sensation shortness of breath blurred vision fatigue.   ALLERGIES: Allergies  Allergen Reactions  . Other     chicken  . Penicillins Swelling  . Sulfa Antibiotics Nausea And Vomiting  . Codeine Itching and Rash    HOME MEDICATIONS: Outpatient Medications Prior to Visit  Medication Sig Dispense Refill  . albuterol (PROVENTIL HFA;VENTOLIN HFA) 108 (90 BASE) MCG/ACT inhaler Inhale 2 puffs into the lungs every 6 (six) hours as needed for wheezing or shortness of breath.    Marland Kitchen albuterol (PROVENTIL) (2.5 MG/3ML) 0.083% nebulizer solution Take 2.5 mg by nebulization every 6 (six) hours as needed for wheezing.    Marland Kitchen  allopurinol (ZYLOPRIM) 100 MG tablet 100 mg daily.    . Cholecalciferol (VITAMIN D-3 PO) Take 1 tablet by mouth daily.    . clonazePAM (KLONOPIN) 0.5 MG tablet TAKE 1/2 TAB BY MOUTH IN THE MORNING & 1 IN THE EVENING TWICE A DAY    . DULoxetine (CYMBALTA) 30 MG capsule Take 30 mg by mouth daily.    . Fluticasone-Salmeterol (ADVAIR) 250-50 MCG/DOSE AEPB Inhale 1 puff into the lungs 2 (two) times daily as needed (for shortness of breath).    . furosemide (LASIX) 40 MG tablet Take 40 mg by mouth daily as needed (for swelling).    . gabapentin (NEURONTIN) 800 MG tablet Take 800 mg by mouth 3 (three) times daily.    Marland Kitchen glipiZIDE (GLUCOTROL XL) 2.5 MG 24 hr tablet Take 2.5 mg by mouth daily with breakfast.    . levocetirizine (XYZAL) 5 MG tablet Take 5 mg by mouth every evening.    Marland Kitchen losartan (COZAAR) 25 MG tablet Take 25 mg by mouth daily.    . metFORMIN (GLUCOPHAGE) 1000 MG tablet Take 1,000 mg by mouth at bedtime as needed (for elevated blood sugar- >140).     . montelukast (SINGULAIR) 10 MG tablet Take 10 mg by mouth daily.  1  . Multiple Vitamin (MULTIVITAMIN WITH MINERALS) TABS Take 1 tablet by mouth daily.    . ondansetron (ZOFRAN ODT) 4 MG disintegrating tablet Take 1 tablet (4 mg total) by mouth every 8 (eight) hours as needed for nausea or vomiting. 15 tablet 0  . oxyCODONE-acetaminophen (PERCOCET) 7.5-325 MG tablet Take 1 tablet by mouth 3 (three)  times daily.  0  . polyethylene glycol (MIRALAX / GLYCOLAX) packet Take 17 g by mouth as needed.    . potassium chloride SA (K-DUR,KLOR-CON) 20 MEQ tablet Take 20 mEq by mouth daily.    . predniSONE (DELTASONE) 50 MG tablet Take 1 tablet (50 mg total) by mouth daily. 5 tablet 0  . rOPINIRole (REQUIP) 0.25 MG tablet Take 0.25 mg by mouth daily.    . ropinirole (REQUIP) 5 MG tablet Take 5 mg by mouth at bedtime.    . simvastatin (ZOCOR) 20 MG tablet Take 20 mg by mouth daily.    . clonazePAM (KLONOPIN) 0.5 MG tablet Take 0.5-1 mg by mouth 2 (two)  times daily as needed for anxiety. 1/2 in the morning and 1 tablet at bedtime    . Biotin 5000 MCG CAPS Take 1 capsule by mouth daily.    . Cyanocobalamin (B-12 PO) Place 1 tablet under the tongue daily.    Marland Kitchen gabapentin (NEURONTIN) 300 MG capsule Take 300 mg by mouth 3 (three) times daily.    . nortriptyline (PAMELOR) 50 MG capsule Take 50 mg by mouth at bedtime.    Marland Kitchen tiotropium (SPIRIVA) 18 MCG inhalation capsule Place 18 mcg into inhaler and inhale daily as needed (for shortness of breath).    . traMADol (ULTRAM) 50 MG tablet Take 50 mg by mouth every 6 (six) hours as needed for pain.    Marland Kitchen albuterol (PROVENTIL HFA;VENTOLIN HFA) 108 (90 BASE) MCG/ACT inhaler Inhale 1-2 puffs into the lungs every 6 (six) hours as needed for wheezing. (Patient not taking: Reported on 05/28/2017) 1 Inhaler 0  . azithromycin (ZITHROMAX) 250 MG tablet Take 1 tablet (250 mg total) by mouth daily. Take first 2 tablets together, then 1 every day until finished. (Patient not taking: Reported on 05/28/2017) 6 tablet 0  . ciprofloxacin (CIPRO) 500 MG tablet Take 1 tablet (500 mg total) by mouth 2 (two) times daily. One po bid x 7 days 14 tablet 0  . glipiZIDE (GLUCOTROL) 10 MG tablet Take 5 mg by mouth daily.    Marland Kitchen lisinopril (PRINIVIL,ZESTRIL) 10 MG tablet Take 10 mg by mouth daily.     No facility-administered medications prior to visit.     PAST MEDICAL HISTORY: Past Medical History:  Diagnosis Date  . Arthritis    rheumatoid  . Asthma   . Bilateral hand pain   . Blood transfusion without reported diagnosis   . Cancer ([place])   . Chronic pain syndrome   . DDD (degenerative disc disease), lumbar   . Diabetes mellitus without complication ([place])   . Hypercholesterolemia   . Hypertension   . Post herpetic neuralgia   . Renal disorder     PAST SURGICAL HISTORY: Past Surgical History:  Procedure Laterality Date  . ABDOMINAL HYSTERECTOMY  1968  . BLADDER SURGERY  1980  . CATARACT EXTRACTION  2006  . JOINT  REPLACEMENT  2008   knee, bilateral  . [place]  . LAPAROSCOPIC GASTRIC BANDING  2006  . PAROTID GLAND TUMOR EXCISION    . SHOULDER SURGERY Left 2010  . SKIN BIOPSY    . TUBAL LIGATION      FAMILY HISTORY: Family History  Problem Relation Age of Onset  . Lung cancer Mother   . Other Father        car accident  . Bladder Cancer Sister   . Cancer Brother        in 1 brother, all brothers w/skin cancer  .  Stroke Sister   . COPD Brother        in 1 brother    SOCIAL HISTORY: Social History   Socioeconomic History  . Marital status: Single    Spouse name: Not on file  . Number of children: 3  . Years of education: business  . Highest education level: Not on file  Occupational History  . Not on file  Social Needs  . Financial resource strain: Not on file  . Food insecurity:    Worry: Not on file    Inability: Not on file  . Transportation needs:    Medical: Not on file    Non-medical: Not on file  Tobacco Use  . Smoking status: Former Smoker    Last attempt to quit: 07/11/2015    Years since quitting: 2.8  . Smokeless tobacco: Never Used  Substance and Sexual Activity  . Alcohol use: Yes    Comment: RARELY  . Drug use: No  . Sexual activity: Not on file  Lifestyle  . Physical activity:    Days per week: Not on file    Minutes per session: Not on file  . Stress: Not on file  Relationships  . Social connections:    Talks on phone: Not on file    Gets together: Not on file    Attends religious service: Not on file    Active member of club or organization: Not on file    Attends meetings of clubs or organizations: Not on file    Relationship status: Not on file  . Intimate partner violence:    Fear of current or ex partner: Not on file    Emotionally abused: Not on file    Physically abused: Not on file    Forced sexual activity: Not on file  Other Topics Concern  . Not on file  Social History Narrative   Lives with dgtr, family, moved here from  [place] 1 1 /2 yrs ago   Caffeine- none     PHYSICAL EXAM  GENERAL EXAM/CONSTITUTIONAL: Vitals:  Vitals:   05/11/18 0817  BP: 108/68  Pulse: 88  Weight: 195 lb 9.6 oz (88.7 kg)  Height: 5' (1.524 m)     Body mass index is 38.2 kg/m. Wt Readings from Last 3 Encounters:  05/11/18 195 lb 9.6 oz (88.7 kg)  11/09/12 180 lb (81.6 kg)  10/31/12 175 lb (79.4 kg)     Patient is in no distress; well developed, nourished and groomed; neck is supple  CARDIOVASCULAR:  Examination of carotid arteries is normal; no carotid bruits  Regular rate and rhythm, no murmurs  Examination of peripheral vascular system by observation and palpation is normal  EYES:  Ophthalmoscopic exam of optic discs and posterior segments is normal; no papilledema or hemorrhages  Visual Acuity Screening   Right eye Left eye Both eyes  Without correction: 20/50 20/40   With correction:        MUSCULOSKELETAL:  Gait, strength, tone, movements noted in Neurologic exam below  NEUROLOGIC: MENTAL STATUS:  No flowsheet data found.  awake, alert, oriented to person, place and time  recent and remote memory intact  normal attention and concentration  language fluent, comprehension intact, naming intact  fund of knowledge appropriate  CRANIAL NERVE:   2nd - no papilledema on fundoscopic exam  2nd, 3rd, 4th, 6th - pupils equal and reactive to light, visual fields full to confrontation, extraocular muscles intact, no nystagmus  5th - facial sensation symmetric  7th -  facial strength symmetric  8th - hearing intact  9th - palate elevates symmetrically, uvula midline  11th - shoulder shrug symmetric  12th - tongue protrusion midline  MOTOR:   normal bulk and tone, full strength in the BUE, BLE; EXCEPT RIGHT DELTOID LIMITED BY PAIN; LEFT HIP FLEXION LIMITED BY PAIN  SENSORY:   normal and symmetric to light touch; DECR temperature, vibration  COORDINATION:   finger-nose-finger, fine  finger movements normal  REFLEXES:   deep tendon reflexes TRACE and symmetric  GAIT/STATION:   WIDE GAIT; STAGGERS; UNSTEADY     DIAGNOSTIC DATA (LABS, IMAGING, TESTING) - I reviewed patient records, labs, notes, testing and imaging myself where available.  Lab Results  Component Value Date   WBC 8.9 05/28/2017   HGB 12.8 05/28/2017   HCT 39.8 05/28/2017   MCV 98.8 05/28/2017   PLT 217 05/28/2017      Component Value Date/Time   NA 138 05/28/2017 0953   K 5.0 05/28/2017 0953   CL 102 05/28/2017 0953   CO2 24 05/28/2017 0953   GLUCOSE 204 (H) 05/28/2017 0953   BUN 15 05/28/2017 0953   CREATININE 1.26 (H) 05/28/2017 0953   CALCIUM 9.2 05/28/2017 0953   PROT 6.9 05/28/2017 0953   ALBUMIN 4.0 05/28/2017 0953   AST 16 05/28/2017 0953   ALT 14 05/28/2017 0953   ALKPHOS 68 05/28/2017 0953   BILITOT 0.5 05/28/2017 0953   GFRNONAA 39 (L) 05/28/2017 0953   GFRAA 45 (L) 05/28/2017 0953   No results found for: CHOL, HDL, LDLCALC, LDLDIRECT, TRIG, CHOLHDL No results found for: HGBA1C No results found for: VITAMINB12 No results found for: TSH   09/24/17 MRI lumbar spine 1. Advanced disc degeneration throughout the lumbar and lower thoracic spine. Prominent degenerative edema at L2-3. 2. Moderate multifactorial spinal stenosis at L2-3 and L3-4 partially due to epidural lipomatosis. 3. Moderate bilateral neural foraminal stenosis at L5-S1. 4. Moderate to severe spinal stenosis at T10-11. No cord compression.   ASSESSMENT AND PLAN  84 y.o. year old female here with new onset headaches in December 2019.  We will proceed with further work-up.  Dx:  1. Double vision   2. Nonintractable headache, unspecified chronicity pattern, unspecified headache type     PLAN:   HEADACHES (new onset since Dec 2019; ddx: sleep apnea, cervicogenic, temporal arteritis, other secondary headaches) - check MRI brain, ESR, CRP  RECURRENT SYNCOPE (last events in 2017; not likely  seizure) - follow up with PCP  GAIT DIFFICULTY (due to spinal stenosis + diabetic neuropathy + knee arthritis) - continue pain mgmt - continue PT exercises - use walker  DOUBLE VISION / HALLUCINATIONS / MEMORY LOSS - could be onset of neurodegenerative disorder; monitor for now - follow up with eye doctor  Orders Placed This Encounter  Procedures  . MR BRAIN W WO CONTRAST  . Sedimentation Rate  . C-reactive Protein  . AChR Abs with Reflex to MuSK   Return if symptoms worsen or fail to improve, for pending if symptoms worsen or fail to improve.    Penni Bombard, MD 08/09/1243, 8:09 AM Certified in Neurology, Neurophysiology and Neuroimaging  [facility] [address], [place], [place] [address] [phone]

## 2018-05-11 NOTE — Telephone Encounter (Signed)
[employer] medicare/medicaid order sent to [facility]. They will obtain the auth and reach out to the pt to schedule.

## 2018-05-11 NOTE — Patient Instructions (Signed)
HEADACHES (new onset since Dec 2019) - check MRI brain, ESR, CRP  GAIT DIFFICULTY (due to spinal stenosis + diabetic neuropathy) - continue pain mgmt - continue PT exercises - use walker  RECURRENT SYNCOPE / PASSING OUT (last events in 2017) - follow up with PCP

## 2018-05-14 ENCOUNTER — Telehealth: Payer: Self-pay | Admitting: *Deleted

## 2018-05-15 DIAGNOSIS — E1142 Type 2 diabetes mellitus with diabetic polyneuropathy: Secondary | ICD-10-CM | POA: Diagnosis not present

## 2018-05-15 DIAGNOSIS — E782 Mixed hyperlipidemia: Secondary | ICD-10-CM | POA: Diagnosis not present

## 2018-05-15 DIAGNOSIS — N183 Chronic kidney disease, stage 3 (moderate): Secondary | ICD-10-CM | POA: Diagnosis not present

## 2018-05-16 DIAGNOSIS — E119 Type 2 diabetes mellitus without complications: Secondary | ICD-10-CM | POA: Diagnosis not present

## 2018-05-18 NOTE — Telephone Encounter (Signed)
LVM requesting call back.

## 2018-05-19 NOTE — Telephone Encounter (Signed)
Agree. -VRP 

## 2018-05-19 NOTE — Telephone Encounter (Signed)
Received call back from daughter, [place]. I informed her patient's lab results are unremarkable, one lab still pending.  She asked about elevated CRP, I advised it is nonspecific. Dr Leta Baptist will wait for MRI result. Daughter hasn't scheduled MRI and has decided to wait for last lab result, also waiting due to corona virus and patient's advanced age.  She verbalized understanding, appreciation of information.

## 2018-05-20 DIAGNOSIS — M47812 Spondylosis without myelopathy or radiculopathy, cervical region: Secondary | ICD-10-CM | POA: Diagnosis not present

## 2018-05-22 DIAGNOSIS — R2681 Unsteadiness on feet: Secondary | ICD-10-CM | POA: Diagnosis not present

## 2018-05-22 DIAGNOSIS — I129 Hypertensive chronic kidney disease with stage 1 through stage 4 chronic kidney disease, or unspecified chronic kidney disease: Secondary | ICD-10-CM | POA: Diagnosis not present

## 2018-05-22 DIAGNOSIS — G4730 Sleep apnea, unspecified: Secondary | ICD-10-CM | POA: Diagnosis not present

## 2018-05-22 DIAGNOSIS — E1142 Type 2 diabetes mellitus with diabetic polyneuropathy: Secondary | ICD-10-CM | POA: Diagnosis not present

## 2018-05-22 DIAGNOSIS — M109 Gout, unspecified: Secondary | ICD-10-CM | POA: Diagnosis not present

## 2018-05-22 DIAGNOSIS — E119 Type 2 diabetes mellitus without complications: Secondary | ICD-10-CM | POA: Diagnosis not present

## 2018-05-22 DIAGNOSIS — M0600 Rheumatoid arthritis without rheumatoid factor, unspecified site: Secondary | ICD-10-CM | POA: Diagnosis not present

## 2018-05-22 DIAGNOSIS — R569 Unspecified convulsions: Secondary | ICD-10-CM | POA: Diagnosis not present

## 2018-05-23 LAB — SEDIMENTATION RATE: Sed Rate: 9 mm/hr (ref 0–40)

## 2018-05-23 LAB — ACHR ABS WITH REFLEX TO MUSK

## 2018-05-23 LAB — C-REACTIVE PROTEIN: CRP: 21 mg/L — ABNORMAL HIGH (ref 0–10)

## 2018-05-23 LAB — MUSK ANTIBODIES: MuSK Antibodies: <1 U/mL

## 2018-05-26 DIAGNOSIS — M549 Dorsalgia, unspecified: Secondary | ICD-10-CM | POA: Diagnosis not present

## 2018-05-26 DIAGNOSIS — M0600 Rheumatoid arthritis without rheumatoid factor, unspecified site: Secondary | ICD-10-CM | POA: Diagnosis not present

## 2018-05-26 DIAGNOSIS — E119 Type 2 diabetes mellitus without complications: Secondary | ICD-10-CM | POA: Diagnosis not present

## 2018-05-26 DIAGNOSIS — M359 Systemic involvement of connective tissue, unspecified: Secondary | ICD-10-CM | POA: Diagnosis not present

## 2018-05-26 DIAGNOSIS — M109 Gout, unspecified: Secondary | ICD-10-CM | POA: Diagnosis not present

## 2018-05-26 DIAGNOSIS — M1990 Unspecified osteoarthritis, unspecified site: Secondary | ICD-10-CM | POA: Diagnosis not present

## 2018-05-26 DIAGNOSIS — M25511 Pain in right shoulder: Secondary | ICD-10-CM | POA: Diagnosis not present

## 2018-05-26 DIAGNOSIS — M791 Myalgia, unspecified site: Secondary | ICD-10-CM | POA: Diagnosis not present

## 2018-05-26 DIAGNOSIS — M2550 Pain in unspecified joint: Secondary | ICD-10-CM | POA: Diagnosis not present

## 2018-05-26 DIAGNOSIS — N289 Disorder of kidney and ureter, unspecified: Secondary | ICD-10-CM | POA: Diagnosis not present

## 2018-05-28 ENCOUNTER — Ambulatory Visit
Admission: RE | Admit: 2018-05-28 | Discharge: 2018-05-28 | Disposition: A | Discharge status: Home / Self Care | Payer: Medicare HMO | Source: Ambulatory Visit | Attending: Diagnostic Neuroimaging | Admitting: Diagnostic Neuroimaging

## 2018-05-28 ENCOUNTER — Other Ambulatory Visit: Payer: Self-pay

## 2018-05-28 DIAGNOSIS — R51 Headache: Principal | ICD-10-CM

## 2018-05-28 DIAGNOSIS — R519 Headache, unspecified: Secondary | ICD-10-CM

## 2018-05-28 MED ORDER — GADOBENATE DIMEGLUMINE 529 MG/ML IV SOLN
20.0000 mL | Freq: Once | INTRAVENOUS | Status: AC | PRN
  Administered 2018-05-28: 20 mL via INTRAVENOUS

## 2018-05-29 ENCOUNTER — Telehealth: Payer: Self-pay | Admitting: Diagnostic Neuroimaging

## 2018-05-29 DIAGNOSIS — G9389 Other specified disorders of brain: Secondary | ICD-10-CM

## 2018-05-29 NOTE — Telephone Encounter (Signed)
I spoke with patient's daughter and son-in-law via phone re: MRI results. Concerning for GBM. Patient's HA are worsening. Will request urgent consult to neuro-oncology (Dr. Mickeal Skinner)  Orders Placed This Encounter  Procedures  . Ambulatory referral to Neurology   Penni Bombard, MD 7/35/7897, 84:78 AM Certified in Neurology, Neurophysiology and Neuroimaging  [facility] [address] [place], [place] [address] [phone]

## 2018-06-01 ENCOUNTER — Other Ambulatory Visit: Payer: Self-pay

## 2018-06-01 ENCOUNTER — Other Ambulatory Visit: Payer: Self-pay | Admitting: Radiation Therapy

## 2018-06-01 ENCOUNTER — Inpatient Hospital Stay: Payer: Medicare HMO | Attending: Internal Medicine | Admitting: Internal Medicine

## 2018-06-01 ENCOUNTER — Encounter: Payer: Self-pay | Admitting: Internal Medicine

## 2018-06-01 DIAGNOSIS — Z87891 Personal history of nicotine dependence: Secondary | ICD-10-CM | POA: Insufficient documentation

## 2018-06-01 DIAGNOSIS — I10 Essential (primary) hypertension: Secondary | ICD-10-CM | POA: Diagnosis not present

## 2018-06-01 DIAGNOSIS — M5136 Other intervertebral disc degeneration, lumbar region: Secondary | ICD-10-CM | POA: Diagnosis not present

## 2018-06-01 DIAGNOSIS — Z7984 Long term (current) use of oral hypoglycemic drugs: Secondary | ICD-10-CM | POA: Insufficient documentation

## 2018-06-01 DIAGNOSIS — R51 Headache: Secondary | ICD-10-CM | POA: Insufficient documentation

## 2018-06-01 DIAGNOSIS — D432 Neoplasm of uncertain behavior of brain, unspecified: Secondary | ICD-10-CM | POA: Diagnosis not present

## 2018-06-01 DIAGNOSIS — E7800 Pure hypercholesterolemia, unspecified: Secondary | ICD-10-CM | POA: Diagnosis not present

## 2018-06-01 DIAGNOSIS — E119 Type 2 diabetes mellitus without complications: Secondary | ICD-10-CM | POA: Insufficient documentation

## 2018-06-01 DIAGNOSIS — G9389 Other specified disorders of brain: Secondary | ICD-10-CM | POA: Insufficient documentation

## 2018-06-01 DIAGNOSIS — R269 Unspecified abnormalities of gait and mobility: Secondary | ICD-10-CM | POA: Diagnosis not present

## 2018-06-01 DIAGNOSIS — R110 Nausea: Secondary | ICD-10-CM | POA: Insufficient documentation

## 2018-06-01 DIAGNOSIS — Z79899 Other long term (current) drug therapy: Secondary | ICD-10-CM | POA: Diagnosis not present

## 2018-06-01 DIAGNOSIS — R900 Intracranial space-occupying lesion found on diagnostic imaging of central nervous system: Secondary | ICD-10-CM | POA: Insufficient documentation

## 2018-06-01 NOTE — Progress Notes (Signed)
[facility] [place], [place] [address] [id number]   New Patient Evaluation  Date of Service: 06/01/18 Patient Name: Stephanie Rivers Patient MRN: [id number] Patient DOB: [birth year] Provider: Ventura Sellers, MD  Identifying Statement:  Stephanie Rivers is a 84 y.o. female with right parietal brain mass who presents for initial consultation and evaluation.    Referring Provider: Penni Bombard, MD [address] [place], [place] [address]  Oncologic History:  Biomarkers:  MGMT Unknown.  IDH 1/2 Unknown.  EGFR Unknown  TERT Unknown   History of Present Illness: The patient's records from the referring physician were obtained and reviewed and the patient interviewed to confirm this HPI.  Stephanie Rivers presented to medical attention after experiencing new onset daily headaches for 4-6 weeks.  They were described as pounding type pain with nausea and photophobia.  She also complained at the time of unsteadiness in her gait.  She normally required a rolling walker for assistance outside of the home, but recently began to need the walker to move about the house because of unsteadiness. Her provided obtained an outpatient MRI brain this past week which demonstrated a large enhancing mass in the right hemisphere.  She continues to complain of headaches and gait impairment as prior, but otherwise denies visual field impairment, motor symptoms, or language impairment.    Medications: Current Outpatient Medications on File Prior to Visit  Medication Sig Dispense Refill   albuterol (PROVENTIL HFA;VENTOLIN HFA) 108 (90 BASE) MCG/ACT inhaler Inhale 2 puffs into the lungs every 6 (six) hours as needed for wheezing or shortness of breath.     albuterol (PROVENTIL) (2.5 MG/3ML) 0.083% nebulizer solution Take 2.5 mg by nebulization every 6 (six) hours as needed for wheezing.     allopurinol (ZYLOPRIM) 100 MG tablet 100 mg  daily.     Biotin 5000 MCG CAPS Take 1 capsule by mouth daily.     Cholecalciferol (VITAMIN D-3 PO) Take 1 tablet by mouth daily.     clonazePAM (KLONOPIN) 0.5 MG tablet TAKE 1/2 TAB BY MOUTH IN THE MORNING & 1 IN THE EVENING TWICE A DAY     Cyanocobalamin (B-12 PO) Place 1 tablet under the tongue daily.     DULoxetine (CYMBALTA) 30 MG capsule Take 30 mg by mouth daily.     Fluticasone-Salmeterol (ADVAIR) 250-50 MCG/DOSE AEPB Inhale 1 puff into the lungs 2 (two) times daily as needed (for shortness of breath).     gabapentin (NEURONTIN) 800 MG tablet Take 800 mg by mouth 3 (three) times daily.     glipiZIDE (GLUCOTROL XL) 2.5 MG 24 hr tablet Take 2.5 mg by mouth daily with breakfast.     levocetirizine (XYZAL) 5 MG tablet Take 5 mg by mouth every evening.     losartan (COZAAR) 25 MG tablet Take 25 mg by mouth daily.     metFORMIN (GLUCOPHAGE) 1000 MG tablet Take 1,000 mg by mouth at bedtime as needed (for elevated blood sugar- >140).      montelukast (SINGULAIR) 10 MG tablet Take 10 mg by mouth daily.  1   Multiple Vitamin (MULTIVITAMIN WITH MINERALS) TABS Take 1 tablet by mouth daily.     nortriptyline (PAMELOR) 50 MG capsule Take 50 mg by mouth at bedtime.     oxyCODONE-acetaminophen (PERCOCET) 7.5-325 MG tablet Take 1 tablet by mouth 3 (three) times daily.  0   potassium chloride SA (K-DUR,KLOR-CON) 20 MEQ tablet Take 20 mEq by  mouth daily.     predniSONE (DELTASONE) 50 MG tablet Take 1 tablet (50 mg total) by mouth daily. 5 tablet 0   rOPINIRole (REQUIP) 0.25 MG tablet Take 0.25 mg by mouth daily.     ropinirole (REQUIP) 5 MG tablet Take 5 mg by mouth at bedtime.     simvastatin (ZOCOR) 20 MG tablet Take 20 mg by mouth daily.     furosemide (LASIX) 40 MG tablet Take 40 mg by mouth daily as needed (for swelling).     gabapentin (NEURONTIN) 300 MG capsule Take 300 mg by mouth 3 (three) times daily.     hydroxychloroquine (PLAQUENIL) 200 MG tablet Take 1 tablet by mouth  See admin instructions. 1 Tablet once a day for 2 weeks then increase to twice a day orally 30 days     ondansetron (ZOFRAN ODT) 4 MG disintegrating tablet Take 1 tablet (4 mg total) by mouth every 8 (eight) hours as needed for nausea or vomiting. (Patient not taking: Reported on 06/01/2018) 15 tablet 0   polyethylene glycol (MIRALAX / GLYCOLAX) packet Take 17 g by mouth as needed.     tiotropium (SPIRIVA) 18 MCG inhalation capsule Place 18 mcg into inhaler and inhale daily as needed (for shortness of breath).     traMADol (ULTRAM) 50 MG tablet Take 50 mg by mouth every 6 (six) hours as needed for pain.     No current facility-administered medications on file prior to visit.     Allergies:  Allergies  Allergen Reactions   Other     chicken   Penicillins Swelling   Sulfa Antibiotics Nausea And Vomiting   Codeine Itching and Rash   Past Medical History:  Past Medical History:  Diagnosis Date   Arthritis    rheumatoid   Asthma    Bilateral hand pain    Blood transfusion without reported diagnosis    Cancer ([place])    Chronic pain syndrome    DDD (degenerative disc disease), lumbar    Diabetes mellitus without complication ([place])    Hypercholesterolemia    Hypertension    Post herpetic neuralgia    Renal disorder    Past Surgical History:  Past Surgical History:  Procedure Laterality Date   [place]   CATARACT EXTRACTION  2006   JOINT REPLACEMENT  2008   knee, bilateral   [place] GASTRIC BANDING  2006   PAROTID GLAND TUMOR EXCISION     SHOULDER SURGERY Left 2010   SKIN BIOPSY     TUBAL LIGATION     Social History:  Social History   Socioeconomic History   Marital status: Single    Spouse name: Not on file   Number of children: 3   Years of education: business   Highest education level: Not on file  Occupational History   Not on file  Social Needs   Financial  resource strain: Not on file   Food insecurity:    Worry: Not on file    Inability: Not on file   Transportation needs:    Medical: Not on file    Non-medical: Not on file  Tobacco Use   Smoking status: Former Smoker    Last attempt to quit: 07/11/2015    Years since quitting: 2.8   Smokeless tobacco: Never Used  Substance and Sexual Activity   Alcohol use: Yes    Comment: RARELY   Drug use: No  Sexual activity: Not on file  Lifestyle   Physical activity:    Days per week: Not on file    Minutes per session: Not on file   Stress: Not on file  Relationships   Social connections:    Talks on phone: Not on file    Gets together: Not on file    Attends religious service: Not on file    Active member of club or organization: Not on file    Attends meetings of clubs or organizations: Not on file    Relationship status: Not on file   Intimate partner violence:    Fear of current or ex partner: Not on file    Emotionally abused: Not on file    Physically abused: Not on file    Forced sexual activity: Not on file  Other Topics Concern   Not on file  Social History Narrative   Lives with dgtr, family, moved here from [place] 1 1 /2 yrs ago   Caffeine- none   Family History:  Family History  Problem Relation Age of Onset   Lung cancer Mother    Other Father        car accident   Bladder Cancer Sister    Cancer Brother        in 1 brother, all brothers w/skin cancer   Stroke Sister    COPD Brother        in 1 brother    Review of Systems: Constitutional: Denies fevers, chills or abnormal weight loss Eyes: +blurry vision Ears, nose, mouth, throat, and face: Denies mucositis or sore throat Respiratory: Denies cough, dyspnea or wheezes Cardiovascular: Denies palpitation, chest discomfort or lower extremity swelling Gastrointestinal:  Denies nausea, constipation, diarrhea GU: Denies dysuria or incontinence Skin: Denies abnormal skin rashes Neurological: Per  HPI Musculoskeletal: +arthiritic joint pain Behavioral/Psych: Denies anxiety, disturbance in thought content, and mood instability  Physical Exam: Vitals:   06/01/18 1042  BP: 140/82  Pulse: 91  Resp: (!) 8  Temp: 98.9 F (37.2 C)  SpO2: 95%   KPS: 80. General: Alert, cooperative, pleasant, in no acute distress Head: Normal EENT: No conjunctival injection or scleral icterus. Oral mucosa moist Lungs: Resp effort normal Cardiac: Regular rate and rhythm Abdomen: Soft, non-distended abdomen Skin: No rashes cyanosis or petechiae. Extremities: No clubbing or edema  Neurologic Exam: Mental Status: Awake, alert, attentive to examiner. Oriented to self and environment. Language is fluent with intact comprehension.  Cranial Nerves: Visual acuity is grossly normal. Visual fields are full. Extra-ocular movements intact. No ptosis. Face is symmetric, tongue midline. Motor: Tone and bulk are normal. Power is full in both arms and legs. Reflexes are symmetric, no pathologic reflexes present. Intact finger to nose bilaterally Sensory: Stocking sensory loss Gait: Ataxic, not independent   Labs: I have reviewed the data as listed    Component Value Date/Time   NA 138 05/28/2017 0953   K 5.0 05/28/2017 0953   CL 102 05/28/2017 0953   CO2 24 05/28/2017 0953   GLUCOSE 204 (H) 05/28/2017 0953   BUN 15 05/28/2017 0953   CREATININE 1.26 (H) 05/28/2017 0953   CALCIUM 9.2 05/28/2017 0953   PROT 6.9 05/28/2017 0953   ALBUMIN 4.0 05/28/2017 0953   AST 16 05/28/2017 0953   ALT 14 05/28/2017 0953   ALKPHOS 68 05/28/2017 0953   BILITOT 0.5 05/28/2017 0953   GFRNONAA 39 (L) 05/28/2017 0953   GFRAA 45 (L) 05/28/2017 0953   Lab Results  Component Value  Date   WBC 8.9 05/28/2017   HGB 12.8 05/28/2017   HCT 39.8 05/28/2017   MCV 98.8 05/28/2017   PLT 217 05/28/2017    Imaging:  Mr Jeri Cos CV Contrast  Result Date: 05/28/2018 CLINICAL DATA:  Non intractable headache. Altered mental  status. Double vision. EXAM: MRI HEAD WITHOUT AND WITH CONTRAST TECHNIQUE: Multiplanar, multiecho pulse sequences of the brain and surrounding structures were obtained without and with intravenous contrast. CONTRAST:  80m MULTIHANCE GADOBENATE DIMEGLUMINE 529 MG/ML IV SOLN COMPARISON:  None. FINDINGS: Brain: There is an infiltrative intra-axial mass affecting the RIGHT posterior temporal lobe and RIGHT occipital lobe. Cross-sectional measurements are 28 x 44 x 32 mm (R-L x A-P x C-C). The lesion shows scattered areas of restricted diffusion. There are small areas of susceptibility representing probable acute hemorrhage. Scattered flow voids are seen centrally. No T1 shortening. The enhancement pattern is irregular and serpiginous, and the lesion is not well encapsulated. There is moderate surrounding edema, but no visible contralateral spread into the splenium. A second infiltrative component has extended anteriorly on the RIGHT, probable subependymal spread through the RIGHT temporal horn to involve the hippocampus and uncus. This component is smaller, 7 x 23 x 6 mm. This lesion demonstrates similar imaging characteristics, T2 hyperintensity, and irregular postcontrast enhancement. No intraventricular or subarachnoid spread of tumor elsewhere is clearly identified. Mild-to-moderate atrophy.  Chronic microvascular ischemic change. a Vascular: Flow voids are maintained Skull and upper cervical spine: Normal marrow signal. Sinuses/Orbits: Negative. Other: None. IMPRESSION: Imaging findings are most consistent with glioblastoma multiforme, with the primary lesion in the RIGHT posterior temporal and occipital lobe, roughly 3 x 4 x 3 cm; moderate surrounding edema, internal microhemorrhage, but no midline shift or contralateral spread. A secondary infiltrative component involving the RIGHT uncus and hippocampus is noted, roughly 1 x 2 x 1 cm. Findings discussed with ordering provider. Electronically Signed   By: JStaci RighterM.D.   On: 05/28/2018 15:47     Assessment/Plan 1. Brain mass  Ms. WHerskowitzpresents with clinical and radiographic syndrome most consistent with primary brain tumor such as glioblastoma.  The radiographic features in particular are not consistent with metastatic process.  We outlined in detail likely pathway for diagnosis and treatment of her primary brain tumor, including brain biopsy, radiation, chemotherapy.  We also presented a palliative/comfort care approach based on limited prognosis due to the following features: -Age >80 -Large, multifocal, unresectable mass -Extensive comorbidity (DM, asthma, chronic pain, RA, RLS, etc.)  She and her family will take the next 1-2 days to discuss information presented today.  They will reach out to [place] preference for conventional workup and treatment vs palliative/comfort care.  Will not place referral to neurosurgery (or palliative/hospice) until that conversation takes place.    We appreciate the opportunity to participate in the care of Stephanie Rivers Follow up at this time is TBD.  We spent twenty additional minutes teaching regarding the natural history, biology, and historical experience in the treatment of brain tumors. We then discussed in detail the current recommendations for therapy focusing on the mode of administration, mechanism of action, anticipated toxicities, and quality of life issues associated with this plan. We also provided teaching sheets for the patient to take home as an additional resource.  All questions were answered. The patient knows to call the clinic with any problems, questions or concerns. No barriers to learning were detected.  The total time spent in the encounter was 60 minutes and  more than 50% was on counseling and review of test results   Ventura Sellers, MD Medical Director of Neuro-Oncology [facility] 06/01/18 3:28 PM

## 2018-06-02 ENCOUNTER — Telehealth: Payer: Self-pay | Admitting: Internal Medicine

## 2018-06-02 NOTE — Telephone Encounter (Signed)
No los per 3/30. °

## 2018-06-04 ENCOUNTER — Telehealth: Payer: Self-pay | Admitting: *Deleted

## 2018-06-04 DIAGNOSIS — R51 Headache: Secondary | ICD-10-CM | POA: Diagnosis not present

## 2018-06-04 DIAGNOSIS — C719 Malignant neoplasm of brain, unspecified: Secondary | ICD-10-CM | POA: Diagnosis not present

## 2018-06-04 DIAGNOSIS — E1165 Type 2 diabetes mellitus with hyperglycemia: Secondary | ICD-10-CM | POA: Diagnosis not present

## 2018-06-04 DIAGNOSIS — J45909 Unspecified asthma, uncomplicated: Secondary | ICD-10-CM | POA: Diagnosis not present

## 2018-06-04 NOTE — Telephone Encounter (Signed)
Patients son in law called on 06/02/2018 and again today to talk to MD about decisions and a few questions.  Message relayed to MD both times.

## 2018-06-10 ENCOUNTER — Telehealth: Payer: Self-pay | Admitting: Neurology

## 2018-06-10 NOTE — Telephone Encounter (Signed)
I called pt, spoke with pt and her daughter. I spent 16 minutes on the phone with them. Pt's meds, allergies, and PMH were updated.  Pt has had a sleep study in [place] 2-3 years ago and was started on cpap. Pt is not using her cpap currently because it does not "fit" her. She reports some weight loss since her last PSG. Pt does not have a DME.  Pt's weight is 191 lb and she is 5'0.  Pt was instructed on how to measure her neck size.  Pt's daughter confirmed that she received the email from [place] and will download the software prior to pt's visit next week.  Epworth Sleepiness Scale 0= would never doze 1= slight chance of dozing 2= moderate chance of dozing 3= high chance of dozing  Sitting and reading: 1 Watching TV: 1 Sitting inactive in a public place (ex. Theater or meeting): 1 As a passenger in a car for an hour without a break: 2 Lying down to rest in the afternoon: 3 Sitting and talking to someone: 0 Sitting quietly after lunch (no alcohol): 1 In a car, while stopped in traffic: 0 Total: 9  FSS: 41

## 2018-06-10 NOTE — Telephone Encounter (Signed)
Due to current COVID 19 pandemic, our office is severely reducing in office visits for at least the next 2 weeks, in order to minimize the risk to our patients and healthcare providers. Our staff will contact you for next steps.  Pt understands that although there may be some limitations with this type of visit, we will take all precautions to reduce any security or privacy concerns.  Pt understands that this will be treated like an in office visit and we will file with pt's insurance, and there may be a patient responsible charge related to this service. Pt's email is [email or web address]. Pt understands that the [employer] webex software must be downloaded and operational on the device pt plans to use for the visit. Pt understands that the nurse will be calling to go over pt's chart. Pt's daughter will be helping with virtual visit. Call pt's daughter to go over chart.

## 2018-06-15 ENCOUNTER — Encounter: Payer: Medicare HMO | Admitting: Nutrition

## 2018-06-18 ENCOUNTER — Institutional Professional Consult (permissible substitution): Payer: Medicare HMO | Admitting: Neurology

## 2018-06-22 ENCOUNTER — Telehealth: Payer: Self-pay | Admitting: *Deleted

## 2018-06-22 NOTE — Telephone Encounter (Signed)
Palliative Nurse Trinidad Curet confirmed patient is in [place] visiting family and upon return tomorrow she will be under [facility].

## 2018-07-07 ENCOUNTER — Telehealth: Payer: Self-pay | Admitting: *Deleted

## 2018-07-07 NOTE — Telephone Encounter (Signed)
Received call from [facility] to advise that patient passed away at home 07-24-18.  Appropriate staff notified.

## 2018-08-03 DEATH — deceased

## 2019-11-13 IMAGING — MR MRI HEAD WITHOUT AND WITH CONTRAST
13 series · 48 of 48 positions shown · IV contrast (20 ml multihance)
Comparison: None.

CLINICAL DATA: Non intractable headache. Altered mental status.
Double vision.

EXAM:
MRI HEAD WITHOUT AND WITH CONTRAST
TECHNIQUE: Multiplanar, multiecho pulse sequences of the brain and surrounding
structures were obtained without and with intravenous contrast.
CONTRAST:  20mL MULTIHANCE GADOBENATE DIMEGLUMINE 529 MG/ML IV SOLN

[Series 5: T1 · sagittal · 4.0mm · 0.75mm/px · 1 of 31 slices shown (1 of 3)]
[im 1/31]
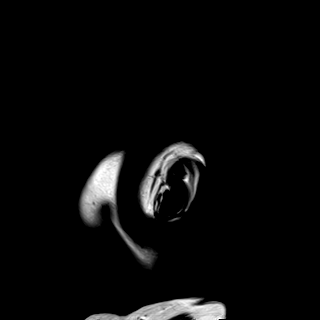

[Series 6: DWI · axial · 3.0mm · 1.44mm/px · z∈[-90,+55]mm · 4 of 90 slices shown (1 of 4)]
[im 1/90]
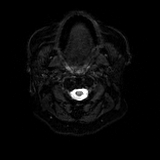
[im 30/90]
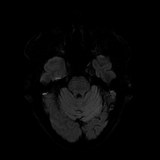
[im 60/90]
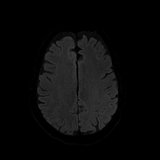
[im 90/90]
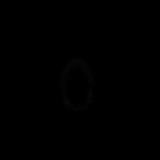

[Series 7: DWI · axial · 3.0mm · 1.44mm/px · z∈[-90,+55]mm · 3 of 45 slices shown (2 of 4)]
[im 1/45]
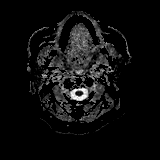
[im 23/45]
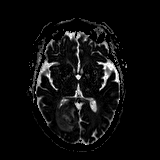
[im 45/45]
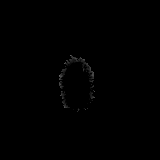

[Series 8: DWI · coronal · 5.0mm · 1.44mm/px · 4 of 68 slices shown (3 of 4)]
[im 1/68]
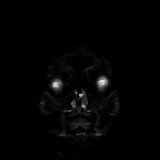
[im 23/68]
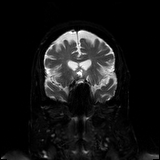
[im 45/68]
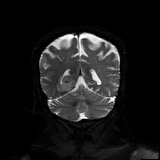
[im 68/68]
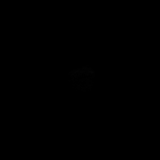

[Series 9: DWI · coronal · 5.0mm · 1.44mm/px · 2 of 34 slices shown (4 of 4)]
[im 1/34]
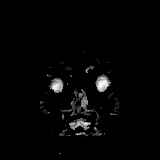
[im 34/34]
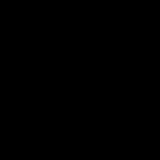

[Series 10: T2 · axial · 4.0mm · 0.36mm/px · z∈[-90,+60]mm · 2 of 30 slices shown]
[im 1/30]
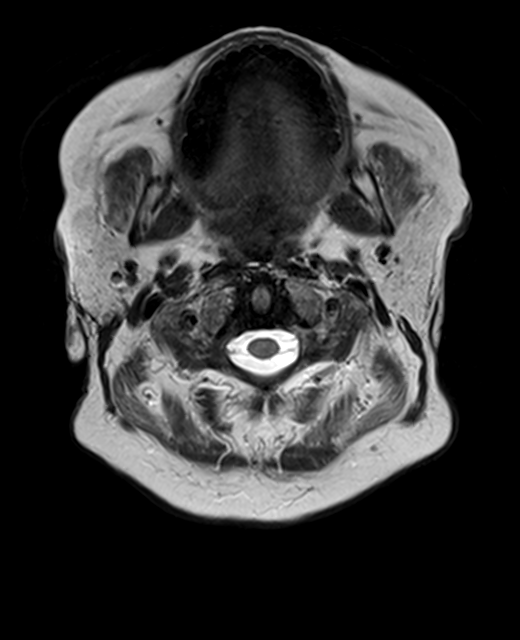
[im 30/30]
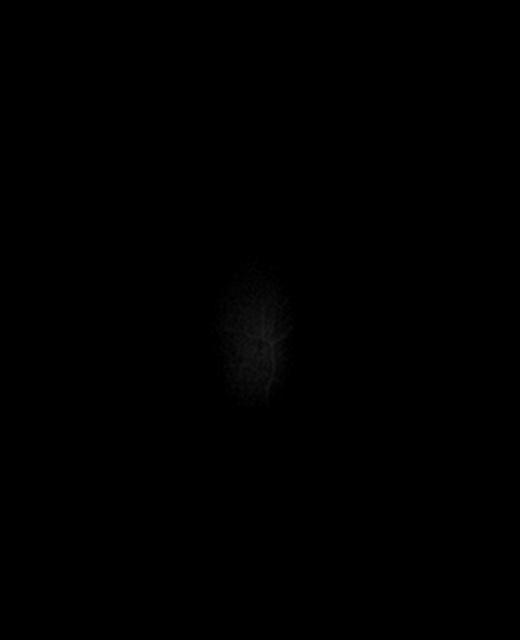

[Series 11: FLAIR · axial · 3.0mm · 0.72mm/px · z∈[-90,+60]mm · 2 of 26 slices shown]
[im 1/26]
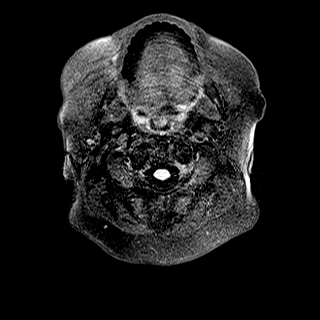
[im 26/26]
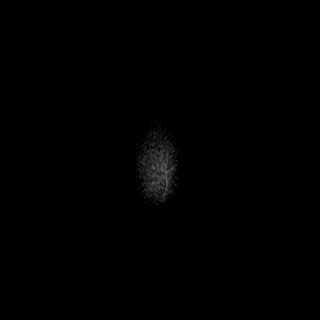

[Series 13: swi_images · axial · 1.5mm · 0.90mm/px · z∈[-86,+56]mm · 6 of 96 slices shown]
[im 1/96]
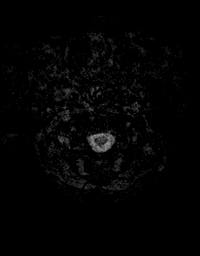
[im 20/96]
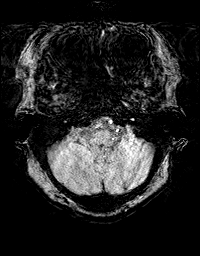
[im 39/96]
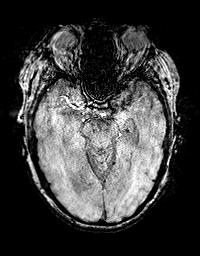
[im 58/96]
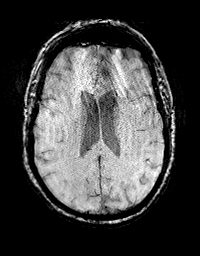
[im 77/96]
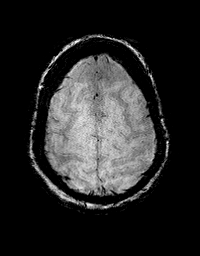
[im 96/96]
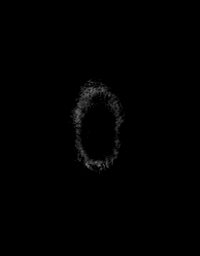

[Series 14: T1 · axial · 1.0mm · 0.90mm/px · z∈[-86,+56]mm · 9 of 144 slices shown (2 of 3)]
[im 1/144]
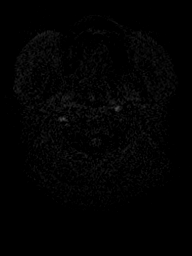
[im 18/144]
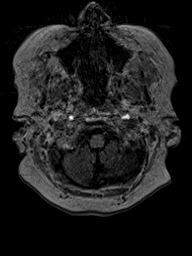
[im 36/144]
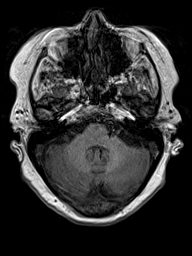
[im 54/144]
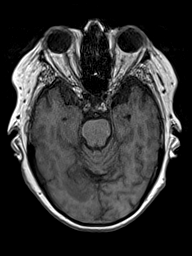
[im 72/144]
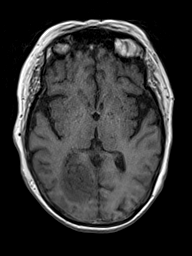
[im 90/144]
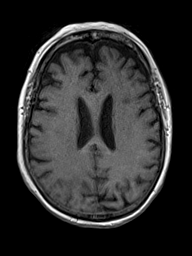
[im 108/144]
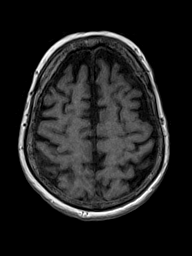
[im 126/144]
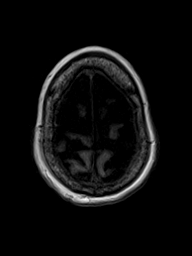
[im 144/144]
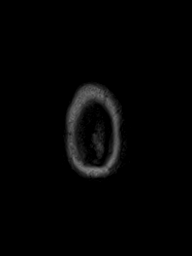

[Series 15: T2 post-contrast · coronal · 4.0mm · 0.36mm/px · 2 of 34 slices shown]
[im 1/34]
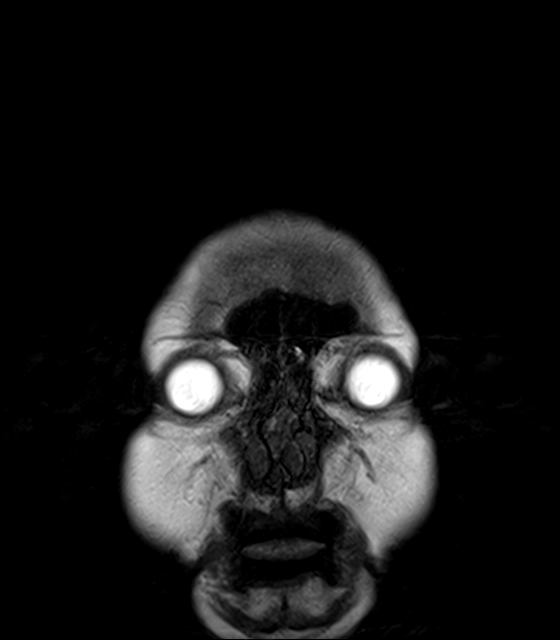
[im 34/34]
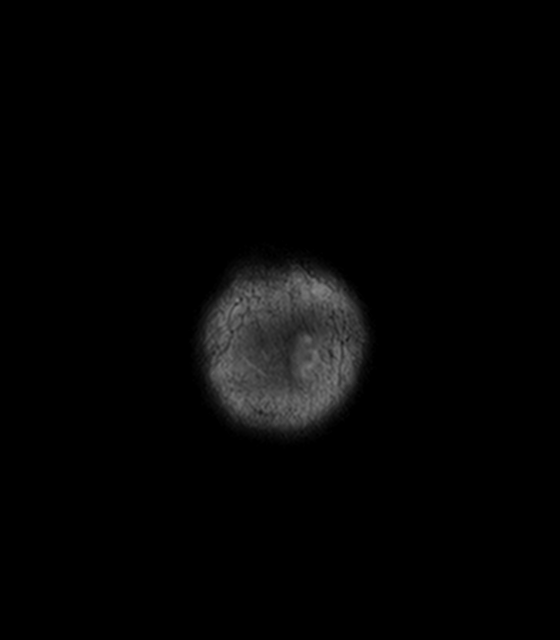

[Series 16: T1 · axial · 1.0mm · 0.90mm/px · z∈[-86,+56]mm · 9 of 144 slices shown (3 of 3)]
[im 1/144]
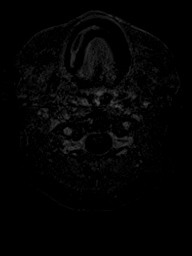
[im 18/144]
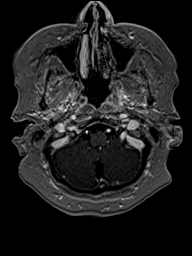
[im 36/144]
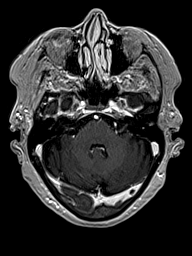
[im 54/144]
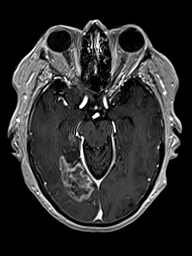
[im 72/144]
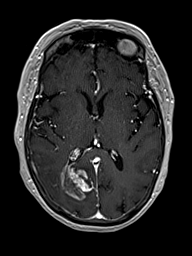
[im 90/144]
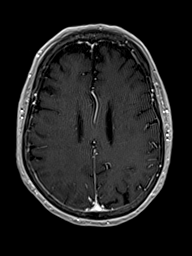
[im 108/144]
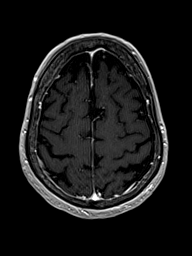
[im 126/144]
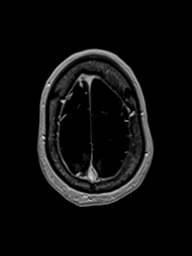
[im 144/144]
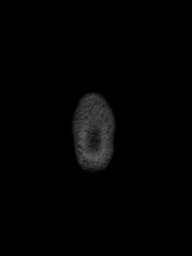

[Series 17: T1 post-contrast · coronal · 4.0mm · 0.72mm/px · 2 of 34 slices shown (1 of 2)]
[im 1/34]
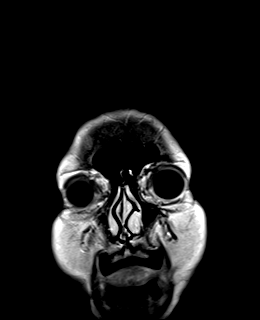
[im 34/34]
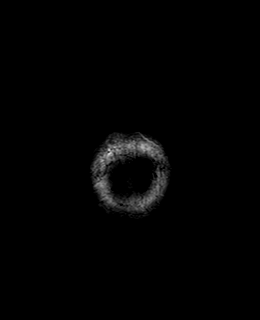

[Series 18: T1 post-contrast · sagittal · 4.0mm · 0.75mm/px · 2 of 31 slices shown (2 of 2)]
[im 1/31]
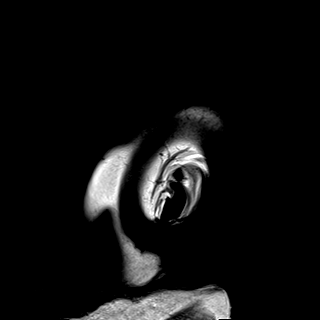
[im 31/31]
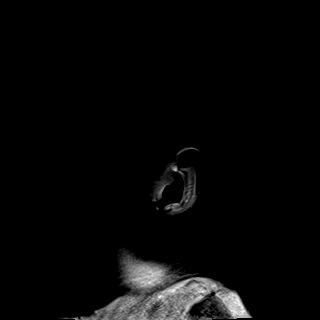

[48 of 48 positions shown; findings below may reference images not displayed]

FINDINGS: Brain: There is an infiltrative intra-axial mass affecting the RIGHT
posterior temporal lobe and RIGHT occipital lobe. Cross-sectional
measurements are 28 x 44 x 32 mm (R-L x A-P x C-C). The lesion shows
scattered areas of restricted diffusion. There are small areas of
susceptibility representing probable acute hemorrhage. Scattered
flow voids are seen centrally. No T1 shortening. The enhancement
pattern is irregular and serpiginous, and the lesion is not well
encapsulated. There is moderate surrounding edema, but no visible
contralateral spread into the splenium.

A second infiltrative component has extended anteriorly on the
RIGHT, probable subependymal spread through the RIGHT temporal horn
to involve the hippocampus and uncus. This component is smaller, 7 x
23 x 6 mm. This lesion demonstrates similar imaging characteristics,
T2 hyperintensity, and irregular postcontrast enhancement.

No intraventricular or subarachnoid spread of tumor elsewhere is
clearly identified.

Mild-to-moderate atrophy.  Chronic microvascular ischemic change.

a

Vascular: Flow voids are maintained

Skull and upper cervical spine: Normal marrow signal.

Sinuses/Orbits: Negative.

Other: None.
IMPRESSION: Imaging findings are most consistent with glioblastoma multiforme,
with the primary lesion in the RIGHT posterior temporal and
occipital lobe, roughly 3 x 4 x 3 cm; moderate surrounding edema,
internal microhemorrhage, but no midline shift or contralateral
spread. A secondary infiltrative component involving the RIGHT uncus
and hippocampus is noted, roughly 1 x 2 x 1 cm.

Findings discussed with ordering provider.
